# Patient Record
Sex: Female | Born: 1970 | Race: White | Hispanic: No | Smoking: Current every day smoker
Health system: Southern US, Community
[De-identification: ages and names within clinical notes are randomized; demographics above are authoritative.]

## PROBLEM LIST (undated history)

## (undated) DIAGNOSIS — I10 Essential (primary) hypertension: Secondary | ICD-10-CM

## (undated) DIAGNOSIS — T8859XA Other complications of anesthesia, initial encounter: Secondary | ICD-10-CM

## (undated) DIAGNOSIS — F99 Mental disorder, not otherwise specified: Secondary | ICD-10-CM

## (undated) DIAGNOSIS — J45909 Unspecified asthma, uncomplicated: Secondary | ICD-10-CM

## (undated) DIAGNOSIS — J449 Chronic obstructive pulmonary disease, unspecified: Secondary | ICD-10-CM

## (undated) HISTORY — DX: Mental disorder, not otherwise specified: F99

## (undated) HISTORY — PX: TUBAL LIGATION: SHX77

## (undated) HISTORY — DX: Essential (primary) hypertension: I10

## (undated) HISTORY — PX: DILATION AND CURETTAGE OF UTERUS: SHX78

## (undated) HISTORY — PX: CHOLECYSTECTOMY: SHX55

---

## 2001-03-18 ENCOUNTER — Emergency Department (HOSPITAL_COMMUNITY): Admission: EM | Admit: 2001-03-18 | Discharge: 2001-03-18 | Payer: Self-pay | Admitting: *Deleted

## 2001-03-18 ENCOUNTER — Encounter: Payer: Self-pay | Admitting: *Deleted

## 2006-01-12 ENCOUNTER — Emergency Department (HOSPITAL_COMMUNITY): Admission: EM | Admit: 2006-01-12 | Discharge: 2006-01-12 | Payer: Self-pay | Admitting: Emergency Medicine

## 2007-04-14 ENCOUNTER — Ambulatory Visit: Payer: Self-pay | Admitting: Cardiology

## 2013-10-10 ENCOUNTER — Encounter (HOSPITAL_COMMUNITY): Payer: Self-pay | Admitting: Emergency Medicine

## 2013-10-10 ENCOUNTER — Emergency Department (HOSPITAL_COMMUNITY)
Admission: EM | Admit: 2013-10-10 | Discharge: 2013-10-11 | Disposition: A | Discharge status: Home / Self Care | Payer: BC Managed Care – PPO | Attending: Emergency Medicine | Admitting: Emergency Medicine

## 2013-10-10 DIAGNOSIS — Z3202 Encounter for pregnancy test, result negative: Secondary | ICD-10-CM | POA: Insufficient documentation

## 2013-10-10 DIAGNOSIS — F32A Depression, unspecified: Secondary | ICD-10-CM

## 2013-10-10 DIAGNOSIS — F3289 Other specified depressive episodes: Secondary | ICD-10-CM | POA: Insufficient documentation

## 2013-10-10 DIAGNOSIS — F17200 Nicotine dependence, unspecified, uncomplicated: Secondary | ICD-10-CM | POA: Insufficient documentation

## 2013-10-10 DIAGNOSIS — Z046 Encounter for general psychiatric examination, requested by authority: Secondary | ICD-10-CM | POA: Insufficient documentation

## 2013-10-10 DIAGNOSIS — F329 Major depressive disorder, single episode, unspecified: Secondary | ICD-10-CM

## 2013-10-10 LAB — COMPREHENSIVE METABOLIC PANEL
ALT: 10 U/L (ref 0–35)
AST: 17 U/L (ref 0–37)
Albumin: 3.7 g/dL (ref 3.5–5.2)
Alkaline Phosphatase: 66 U/L (ref 39–117)
Anion gap: 11 (ref 5–15)
BUN: 8 mg/dL (ref 6–23)
CO2: 25 mEq/L (ref 19–32)
Calcium: 9 mg/dL (ref 8.4–10.5)
Chloride: 102 mEq/L (ref 96–112)
Creatinine, Ser: 0.72 mg/dL (ref 0.50–1.10)
GFR calc Af Amer: >90 mL/min (ref >90)
GFR calc non Af Amer: >90 mL/min (ref >90)
Glucose, Bld: 110 mg/dL — ABNORMAL HIGH (ref 70–99)
Potassium: 4 mEq/L (ref 3.7–5.3)
Sodium: 138 mEq/L (ref 137–147)
Total Bilirubin: 0.2 mg/dL — ABNORMAL LOW (ref 0.3–1.2)
Total Protein: 7.1 g/dL (ref 6.0–8.3)

## 2013-10-10 LAB — CBC WITH DIFFERENTIAL/PLATELET
Basophils Absolute: 0 10*3/uL (ref 0.0–0.1)
Basophils Relative: 0 % (ref 0–1)
Eosinophils Absolute: 0.1 10*3/uL (ref 0.0–0.7)
Eosinophils Relative: 1 % (ref 0–5)
HCT: 39.7 % (ref 36.0–46.0)
Hemoglobin: 13.9 g/dL (ref 12.0–15.0)
Lymphocytes Relative: 30 % (ref 12–46)
Lymphs Abs: 2.6 10*3/uL (ref 0.7–4.0)
MCH: 32.2 pg (ref 26.0–34.0)
MCHC: 35 g/dL (ref 30.0–36.0)
MCV: 91.9 fL (ref 78.0–100.0)
Monocytes Absolute: 0.6 10*3/uL (ref 0.1–1.0)
Monocytes Relative: 7 % (ref 3–12)
Neutro Abs: 5.3 10*3/uL (ref 1.7–7.7)
Neutrophils Relative %: 62 % (ref 43–77)
Platelets: 243 10*3/uL (ref 150–400)
RBC: 4.32 MIL/uL (ref 3.87–5.11)
RDW: 11.5 % (ref 11.5–15.5)
WBC: 8.5 10*3/uL (ref 4.0–10.5)

## 2013-10-10 NOTE — ED Notes (Addendum)
Pt states she is depressed and needs to be voluntary committed. Pt states her thoughts are racing and she is stressed out.

## 2013-10-11 LAB — RAPID URINE DRUG SCREEN, HOSP PERFORMED
Amphetamines: NOT DETECTED
Barbiturates: NOT DETECTED
Benzodiazepines: NOT DETECTED
Cocaine: NOT DETECTED
Opiates: NOT DETECTED
Tetrahydrocannabinol: NOT DETECTED

## 2013-10-11 LAB — URINALYSIS, ROUTINE W REFLEX MICROSCOPIC
Bilirubin Urine: NEGATIVE
Glucose, UA: NEGATIVE mg/dL
Ketones, ur: NEGATIVE mg/dL
Leukocytes, UA: NEGATIVE
Nitrite: NEGATIVE
Protein, ur: NEGATIVE mg/dL
Specific Gravity, Urine: <1.005 — ABNORMAL LOW (ref 1.005–1.030)
Urobilinogen, UA: 0.2 mg/dL (ref 0.0–1.0)
pH: 6 (ref 5.0–8.0)

## 2013-10-11 LAB — URINE MICROSCOPIC-ADD ON

## 2013-10-11 LAB — PREGNANCY, URINE: Preg Test, Ur: NEGATIVE

## 2013-10-11 MED ORDER — ACETAMINOPHEN 325 MG PO TABS
650.0000 mg | ORAL_TABLET | ORAL | Status: DC | PRN
  Administered 2013-10-11: 650 mg via ORAL
  Filled 2013-10-11: qty 2

## 2013-10-11 MED ORDER — ONDANSETRON HCL 4 MG PO TABS: 4.0000 mg | ORAL_TABLET | Freq: Three times a day (TID) | ORAL | Status: DC | PRN

## 2013-10-11 MED ORDER — LORAZEPAM 1 MG PO TABS
1.0000 mg | ORAL_TABLET | Freq: Three times a day (TID) | ORAL | Status: DC | PRN
  Administered 2013-10-11: 1 mg via ORAL
  Filled 2013-10-11: qty 1

## 2013-10-11 MED ORDER — ZOLPIDEM TARTRATE 5 MG PO TABS: 5.0000 mg | ORAL_TABLET | Freq: Every evening | ORAL | Status: DC | PRN

## 2013-10-11 MED ORDER — IBUPROFEN 400 MG PO TABS: 600.0000 mg | ORAL_TABLET | Freq: Three times a day (TID) | ORAL | Status: DC | PRN

## 2013-10-11 NOTE — ED Notes (Signed)
Discharge instructions reviewed with pt, questions answered. Pt verbalized understanding.  

## 2013-10-11 NOTE — ED Provider Notes (Signed)
Discussed with the [facility] counselor - pt is not endorsing any SI - just wants to be back on an antidepressant - she is amenable to f/u in the outpt setting.  I have confirmed this with the pt - I agree with the assessment - her mood is slightly depressed but she is hopeful for a good future with outpt counseling.    Johnna Acosta, MD 10/11/13 [phone]

## 2013-10-11 NOTE — ED Notes (Signed)
Patient complaining of headache. Rates pain at a 5.

## 2013-10-11 NOTE — ED Provider Notes (Signed)
CSN: [id number]     Arrival date & time 10/10/13  2050 History   First MD Initiated Contact with Patient 10/10/13 2056     Chief Complaint  Patient presents with  . V70.1     (Consider location/radiation/quality/duration/timing/severity/associated sxs/prior Treatment) HPI  15yF with depression. Family stress. Feels unappreciated by family. Uncontrollable crying at times. Apathy. Wondering if it would be better if she were dead. No plan to hurt herself. No SI. Denies drug or other substance abuse. Feels like she has no one to talk to. No previous psych care.   History reviewed. No pertinent past medical history. History reviewed. No pertinent past surgical history. History reviewed. No pertinent family history. History  Substance Use Topics  . Smoking status: Current Every Day Smoker    Types: Cigarettes  . Smokeless tobacco: Not on file  . Alcohol Use: No   OB History   Grav Para Term Preterm Abortions TAB SAB Ect Mult Living                 Review of Systems  All systems reviewed and negative, other than as noted in HPI.   Allergies  Sulfur; Clindamycin/lincomycin; and Codeine  Home Medications   Prior to Admission medications   Medication Sig Start Date End Date Taking? Authorizing Provider  acetaminophen (TYLENOL) 500 MG tablet Take 500 mg by mouth every 6 (six) hours as needed for mild pain or moderate pain.   Yes Historical Provider, MD  ALPRAZolam Duanne Moron) 0.5 MG tablet Take 1 tablet by mouth 2 (two) times daily as needed. For anxiety 09/21/13  Yes Historical Provider, MD  ENDOCET 10-325 MG per tablet Take 0.5-1 tablets by mouth every 6 (six) hours as needed. For pain 09/18/13  Yes Historical Provider, MD   BP 127/78  Pulse 67  Temp(Src) 98.2 F (36.8 C) (Oral)  Resp 20  Ht 5\' 6"  (1.676 m)  Wt 164 lb (74.39 kg)  BMI 26.48 kg/m2  SpO2 100% Physical Exam  Nursing note and vitals reviewed. Constitutional: She is oriented to person, place, and time. She appears  well-developed and well-nourished. No distress.  HENT:  Head: Normocephalic and atraumatic.  Eyes: Conjunctivae are normal. Pupils are equal, round, and reactive to light. Right eye exhibits no discharge. Left eye exhibits no discharge.  Neck: Neck supple.  Cardiovascular: Normal rate, regular rhythm and normal heart sounds.  Exam reveals no gallop and no friction rub.   No murmur heard. Pulmonary/Chest: Effort normal and breath sounds normal. No respiratory distress.  Abdominal: Soft. She exhibits no distension. There is no tenderness.  Musculoskeletal: She exhibits no edema and no tenderness.  Neurological: She is alert and oriented to person, place, and time. She exhibits normal muscle tone.  Skin: Skin is warm and dry.  Psychiatric:  Speech clear. content appropriate. Crying at times. Endorses feelings of hopelessness. Doesn't appear to be responding to internal stimuli.     ED Course  Procedures (including critical care time) Labs Review Labs Reviewed  COMPREHENSIVE METABOLIC PANEL - Abnormal; Notable for the following:    Glucose, Bld 110 (*)    Total Bilirubin 0.2 (*)    All other components within normal limits  CBC WITH DIFFERENTIAL  URINALYSIS, ROUTINE W REFLEX MICROSCOPIC  URINE RAPID DRUG SCREEN (HOSP PERFORMED)  PREGNANCY, URINE    Imaging Review No results found.   EKG Interpretation None      MDM   Final diagnoses:  Depression    57yF with depression. Little support.  No previous psych care. No SI or HI. I don't feel she needs to necessarily be committed, but may possible benefit from inpt. Will obtain psych eval for possible psych meds and dispo recommendations.    Virgel Manifold, MD 10/11/13 [phone]

## 2013-10-11 NOTE — BH Assessment (Signed)
Tele Assessment Note   Robin Baldwin is an 43 y.o. female presenting to [facility] for an evaluation. Patient presented to [facility] via EMS. Sts that her coworkers called b/c patient was tearful, anxious, and so depressed that she could not do her job. Patient sts that she has a lot of stressors in her life at this time. Her stress is related mainly to her relationship with her mother. Sts that she has lived with her mother x5 yrs. Since living with her mother they have not gotten along. Patient's mother will not allow her to date and has "ran off" any boyfriends she has had in the past. Her mother questions her about where she is going and when she is returning home. Her mother also Geologist, engineering with patient and her brothers. Patient feels that her mother doesn't like her and wants her dead. Sts that she can't move out b/c her mother is unable to financially make it on her own. Patient helps her mother with household finances by living in the same home.   Patient denies SI and HI. No AVH's. No alcohol of drug use. She does not have a psychiatrist/therapist.    Axis I: Depressive Disorder NOS Axis II: Deferred Axis III: History reviewed. No pertinent past medical history. Axis IV: other psychosocial or environmental problems, problems related to social environment, problems with access to health care services and problems with primary support group Axis V: 31-40 impairment in reality testing  Past Medical History: History reviewed. No pertinent past medical history.  History reviewed. No pertinent past surgical history.  Family History: History reviewed. No pertinent family history.  Social History:  reports that she has been smoking Cigarettes.  She has been smoking about 0.00 packs per day. She does not have any smokeless tobacco history on file. She reports that she does not drink alcohol or use illicit drugs.  Additional Social History:  Alcohol / Drug Use Pain Medications: SEE  MAR Prescriptions: SEE MAR Over the Counter: SEE MAR History of alcohol / drug use?: No history of alcohol / drug abuse  CIWA: CIWA-Ar BP: 127/78 mmHg Pulse Rate: 67 COWS:    Allergies:  Allergies  Allergen Reactions  . Sulfur Hives and Other (See Comments)    Flushing of skin, burning sensation, blisters  . Clindamycin/Lincomycin Hives  . Codeine Itching    Home Medications:  (Not in a hospital admission)  OB/GYN Status:  No LMP recorded.  General Assessment Data Location of Assessment: [facility] Is this a Tele or Face-to-Face Assessment?: Tele Assessment Living Arrangements: Other (Comment) (pt lives with mother ) Can pt return to current living arrangement?: Yes Admission Status: Voluntary Is patient capable of signing voluntary admission?: Yes Transfer from: [facility] Referral Source: Self/Family/Friend     [place] Living Arrangements: Other (Comment) (pt lives with mother ) Name of Psychiatrist:  (No psychiatrist ) Name of Therapist:  (No therapist )     Risk to self Suicidal Ideation: No Suicidal Intent: No Is patient at risk for suicide?: No Suicidal Plan?: No Access to Means: No What has been your use of drugs/alcohol within the last 12 months?:  (patient denies ) Previous Attempts/Gestures: No How many times?:  (0) Other Self Harm Risks:  (n/a) Triggers for Past Attempts: Other (Comment) (no previous attempts or gestures ) Intentional Self Injurious Behavior: None Family Suicide History: No Recent stressful life event(s): Other (Comment) (conflict with mother ) Persecutory voices/beliefs?: No Depression: Yes Depression Symptoms: Feeling angry/irritable;Feeling worthless/self  pity;Loss of interest in usual pleasures;Guilt;Fatigue;Isolating;Tearfulness;Insomnia;Despondent Substance abuse history and/or treatment for substance abuse?: No Suicide prevention information given to non-admitted patients: Not applicable  Risk to  Others Homicidal Ideation: No Thoughts of Harm to Others: No Current Homicidal Intent: No Current Homicidal Plan: No Access to Homicidal Means: No Identified Victim:  (n/a) History of harm to others?: No Assessment of Violence: None Noted Violent Behavior Description:  (patient is calm and cooperative ) Does patient have access to weapons?: No Criminal Charges Pending?: No Does patient have a court date: Yes  Psychosis Hallucinations: None noted Delusions: None noted  Mental Status Report Appear/Hygiene: Disheveled Eye Contact: Good Motor Activity: Freedom of movement Speech: Logical/coherent Level of Consciousness: Alert Mood: Depressed Affect: Appropriate to circumstance Anxiety Level: Minimal Thought Processes: Coherent;Relevant Judgement: Unimpaired Orientation: Person;Place;Time;Situation Obsessive Compulsive Thoughts/Behaviors: None  Cognitive Functioning Concentration: Decreased Memory: Recent Intact;Remote Intact IQ: Average Insight: Good Impulse Control: Fair Appetite: Poor Weight Loss:  (none reported ) Weight Gain:  (none reported) Sleep: No Change Vegetative Symptoms: None  ADLScreening [facility] Assessment Services) Patient's cognitive ability adequate to safely complete daily activities?: Yes Patient able to express need for assistance with ADLs?: Yes Independently performs ADLs?: Yes (appropriate for developmental age)  Prior Inpatient Therapy Prior Inpatient Therapy: No Prior Therapy Dates:  (n/a) Prior Therapy Facilty/Provider(s):  (n/a) Reason for Treatment:  (n/a)  Prior Outpatient Therapy Prior Outpatient Therapy: No Prior Therapy Dates: n/a Prior Therapy Facilty/Provider(s):  (n/a) Reason for Treatment:  (n/a)  ADL Screening (condition at time of admission) Patient's cognitive ability adequate to safely complete daily activities?: Yes Is the patient deaf or have difficulty hearing?: No Does the patient have difficulty seeing, even when  wearing glasses/contacts?: No Does the patient have difficulty concentrating, remembering, or making decisions?: No Patient able to express need for assistance with ADLs?: Yes Does the patient have difficulty dressing or bathing?: No Independently performs ADLs?: Yes (appropriate for developmental age) Does the patient have difficulty walking or climbing stairs?: No Weakness of Legs: None Weakness of Arms/Hands: None  Home Assistive Devices/Equipment Home Assistive Devices/Equipment: None    Abuse/Neglect Assessment (Assessment to be complete while patient is alone) Physical Abuse: Denies Verbal Abuse: Denies Sexual Abuse: Denies Exploitation of patient/patient's resources: Denies Self-Neglect: Denies Values / Beliefs Cultural Requests During Hospitalization: None Spiritual Requests During Hospitalization: None   Advance Directives (For Healthcare) Advance Directive: Patient does not have advance directive Nutrition Screen- [place] Adult/[facility] Patient's home diet: Regular  Additional Information 1:1 In Past 12 Months?: No CIRT Risk: No Elopement Risk: No Does patient have medical clearance?: Yes     Disposition:  Disposition Initial Assessment Completed for this Encounter: Yes Disposition of Patient: Other dispositions;Outpatient treatment (Discharge home per [facility] with outpatient referrals ) Type of outpatient treatment: Adult ([facility] outpatient ) Other disposition(s): Other (Comment)  Writer informed EDP and patient's nurse of discharge instructions. EDP agreed to the discharge plan with the appropriate follow up plan. Patient to follow up with Dr. Harrington Challenger at the [facility] office for outpatient services. Writer faxed the office's contact information to patient's nurse at [place].   Waldon Merl [facility] 10/11/2013 7:59 AM

## 2013-10-11 NOTE — Discharge Instructions (Signed)

## 2014-10-14 ENCOUNTER — Encounter (HOSPITAL_COMMUNITY): Payer: Self-pay | Admitting: *Deleted

## 2014-10-14 ENCOUNTER — Emergency Department (HOSPITAL_COMMUNITY): Payer: Self-pay

## 2014-10-14 ENCOUNTER — Emergency Department (HOSPITAL_COMMUNITY)
Admission: EM | Admit: 2014-10-14 | Discharge: 2014-10-14 | Discharge status: Against Medical Advice | Payer: Self-pay | Attending: Emergency Medicine | Admitting: Emergency Medicine

## 2014-10-14 DIAGNOSIS — J449 Chronic obstructive pulmonary disease, unspecified: Secondary | ICD-10-CM | POA: Insufficient documentation

## 2014-10-14 DIAGNOSIS — R079 Chest pain, unspecified: Secondary | ICD-10-CM | POA: Insufficient documentation

## 2014-10-14 DIAGNOSIS — Z720 Tobacco use: Secondary | ICD-10-CM | POA: Insufficient documentation

## 2014-10-14 HISTORY — DX: Chronic obstructive pulmonary disease, unspecified: J44.9

## 2014-10-14 LAB — CBC WITH DIFFERENTIAL/PLATELET
BASOS ABS: 0 10*3/uL (ref 0.0–0.1)
Basophils Relative: 1 % (ref 0–1)
Eosinophils Absolute: 0.1 10*3/uL (ref 0.0–0.7)
Eosinophils Relative: 1 % (ref 0–5)
HEMATOCRIT: 39.5 % (ref 36.0–46.0)
HEMOGLOBIN: 13.1 g/dL (ref 12.0–15.0)
LYMPHS PCT: 29 % (ref 12–46)
Lymphs Abs: 2.4 10*3/uL (ref 0.7–4.0)
MCH: 31.2 pg (ref 26.0–34.0)
MCHC: 33.2 g/dL (ref 30.0–36.0)
MCV: 94 fL (ref 78.0–100.0)
MONOS PCT: 8 % (ref 3–12)
Monocytes Absolute: 0.7 10*3/uL (ref 0.1–1.0)
NEUTROS ABS: 5.1 10*3/uL (ref 1.7–7.7)
Neutrophils Relative %: 61 % (ref 43–77)
Platelets: 230 10*3/uL (ref 150–400)
RBC: 4.2 MIL/uL (ref 3.87–5.11)
RDW: 12.4 % (ref 11.5–15.5)
WBC: 8.2 10*3/uL (ref 4.0–10.5)

## 2014-10-14 LAB — BASIC METABOLIC PANEL
ANION GAP: 4 — AB (ref 5–15)
BUN: 9 mg/dL (ref 6–20)
CO2: 26 mmol/L (ref 22–32)
Calcium: 8.4 mg/dL — ABNORMAL LOW (ref 8.9–10.3)
Chloride: 109 mmol/L (ref 101–111)
Creatinine, Ser: 0.68 mg/dL (ref 0.44–1.00)
GFR calc Af Amer: >60 mL/min (ref >60)
GFR calc non Af Amer: >60 mL/min (ref >60)
Glucose, Bld: 89 mg/dL (ref 65–99)
POTASSIUM: 3.8 mmol/L (ref 3.5–5.1)
Sodium: 139 mmol/L (ref 135–145)

## 2014-10-14 LAB — TROPONIN I: Troponin I: <0.03 ng/mL (ref <0.031)

## 2014-10-14 NOTE — ED Notes (Signed)
Pt with hx of COPD, working outside this morning and began to have mid CP, pt states significant family hx of heart disease/MI

## 2015-12-29 ENCOUNTER — Ambulatory Visit (INDEPENDENT_AMBULATORY_CARE_PROVIDER_SITE_OTHER): Payer: BLUE CROSS/BLUE SHIELD | Admitting: Women's Health

## 2015-12-29 ENCOUNTER — Encounter: Payer: Self-pay | Admitting: Women's Health

## 2015-12-29 VITALS — BP 120/80 | HR 88 | Ht 66.0 in | Wt 187.0 lb

## 2015-12-29 DIAGNOSIS — N921 Excessive and frequent menstruation with irregular cycle: Secondary | ICD-10-CM | POA: Diagnosis not present

## 2015-12-29 DIAGNOSIS — R42 Dizziness and giddiness: Secondary | ICD-10-CM | POA: Diagnosis not present

## 2015-12-29 DIAGNOSIS — Z86018 Personal history of other benign neoplasm: Secondary | ICD-10-CM | POA: Diagnosis not present

## 2015-12-29 DIAGNOSIS — R252 Cramp and spasm: Secondary | ICD-10-CM

## 2015-12-29 LAB — POCT WET PREP (WET MOUNT)
CLUE CELLS WET PREP WHIFF POC: NEGATIVE
Trichomonas Wet Prep HPF POC: ABSENT

## 2015-12-29 LAB — POCT HEMOGLOBIN: HEMOGLOBIN: 12.9 g/dL (ref 12.2–16.2)

## 2015-12-29 NOTE — Addendum Note (Signed)
Addended by: Traci Sermon A on: 12/29/2015 03:32 PM   Modules accepted: Orders

## 2015-12-29 NOTE — Patient Instructions (Signed)
Call us when your period starts to get a pelvic ultrasound scheduled for when your period goes off

## 2015-12-29 NOTE — Progress Notes (Signed)
   [facility] Visit  Patient name: Robin Baldwin MRN [id number]  Date of birth: 1970-10-27  CC & HPI:  Robin Baldwin is a 45 y.o. G66P0020 Caucasian female presenting today for report of heavy irregular periods lasting 5-6d since BTL 2010, but has been worse for last 50mths. Changing saturated super tampon q 2-3hrs w/ some overflow onto pad. Quarter-sized clots. +cramping. Has intermenstrual bleeding that is lighter. Weak/dizzy during periods. Had labs w/ PCP ~1-9mths ago, unaware of any abnormal labs.  Was told at time of BTL she has uterine fibroids. Hasn't had pap since 2010- was normal then. H/O urinary retention x 2 w/ anesthesia so is concerned about having surgery.   Patient's last menstrual period was 12/18/2015.   Pertinent History Reviewed:  Medical & Surgical Hx:   Past medical, surgical, family, and social history reviewed in electronic medical record Medications: Reviewed & Updated - see associated section Allergies: Reviewed in electronic medical record  Objective Findings:  Vitals: BP 120/80   Pulse 88   Ht 5\' 6"  (1.676 m)   Wt 187 lb (84.8 kg)   LMP 12/18/2015   BMI 30.18 kg/m  Body mass index is 30.18 kg/m.  Physical Examination: General appearance - alert, well appearing, and in no distress Pelvic - cx nulliparous w/o lesions/polyps, normal nonodorous d/c Bimanual: uterus feels normal size w/o tenderness, no adnexal masses/tenderness  Fingerstick Hgb today: 12.9  Assessment & Plan:  A:   Menometrorrhagia  H/O fibroids  S/P BTL  P:  Needs pap  Return for will call when period starts for pelvic u/s when period is off. w/ f/u w/ me after to discuss results  GC/CT from urine today  Tawnya Crook CNM, [facility] 12/29/2015 3:22 PM

## 2015-12-31 LAB — GC/CHLAMYDIA PROBE AMP
Chlamydia trachomatis, NAA: NEGATIVE
Neisseria gonorrhoeae by PCR: NEGATIVE

## 2016-01-09 ENCOUNTER — Emergency Department (HOSPITAL_COMMUNITY)
Admission: EM | Admit: 2016-01-09 | Discharge: 2016-01-09 | Disposition: A | Discharge status: Home / Self Care | Payer: BLUE CROSS/BLUE SHIELD | Attending: Emergency Medicine | Admitting: Emergency Medicine

## 2016-01-09 ENCOUNTER — Emergency Department (HOSPITAL_COMMUNITY): Payer: BLUE CROSS/BLUE SHIELD

## 2016-01-09 ENCOUNTER — Encounter (HOSPITAL_COMMUNITY): Payer: Self-pay | Admitting: Emergency Medicine

## 2016-01-09 DIAGNOSIS — Z79899 Other long term (current) drug therapy: Secondary | ICD-10-CM | POA: Insufficient documentation

## 2016-01-09 DIAGNOSIS — J40 Bronchitis, not specified as acute or chronic: Secondary | ICD-10-CM | POA: Insufficient documentation

## 2016-01-09 DIAGNOSIS — J449 Chronic obstructive pulmonary disease, unspecified: Secondary | ICD-10-CM | POA: Diagnosis not present

## 2016-01-09 DIAGNOSIS — F17210 Nicotine dependence, cigarettes, uncomplicated: Secondary | ICD-10-CM | POA: Insufficient documentation

## 2016-01-09 DIAGNOSIS — R42 Dizziness and giddiness: Secondary | ICD-10-CM | POA: Diagnosis present

## 2016-01-09 LAB — CBG MONITORING, ED: GLUCOSE-CAPILLARY: 91 mg/dL (ref 65–99)

## 2016-01-09 LAB — CBC
HEMATOCRIT: 39.6 % (ref 36.0–46.0)
HEMOGLOBIN: 13.8 g/dL (ref 12.0–15.0)
MCH: 32.1 pg (ref 26.0–34.0)
MCHC: 34.8 g/dL (ref 30.0–36.0)
MCV: 92.1 fL (ref 78.0–100.0)
Platelets: 244 10*3/uL (ref 150–400)
RBC: 4.3 MIL/uL (ref 3.87–5.11)
RDW: 12 % (ref 11.5–15.5)
WBC: 8.9 10*3/uL (ref 4.0–10.5)

## 2016-01-09 LAB — URINALYSIS, ROUTINE W REFLEX MICROSCOPIC
Bilirubin Urine: NEGATIVE
Glucose, UA: NEGATIVE mg/dL
Hgb urine dipstick: NEGATIVE
Ketones, ur: NEGATIVE mg/dL
Leukocytes, UA: NEGATIVE
NITRITE: NEGATIVE
PH: 6.5 (ref 5.0–8.0)
Protein, ur: NEGATIVE mg/dL
SPECIFIC GRAVITY, URINE: 1.01 (ref 1.005–1.030)

## 2016-01-09 LAB — BASIC METABOLIC PANEL
ANION GAP: 8 (ref 5–15)
BUN: 9 mg/dL (ref 6–20)
CHLORIDE: 105 mmol/L (ref 101–111)
CO2: 24 mmol/L (ref 22–32)
Calcium: 8.9 mg/dL (ref 8.9–10.3)
Creatinine, Ser: 0.58 mg/dL (ref 0.44–1.00)
Glucose, Bld: 91 mg/dL (ref 65–99)
POTASSIUM: 3.8 mmol/L (ref 3.5–5.1)
SODIUM: 137 mmol/L (ref 135–145)

## 2016-01-09 MED ORDER — SODIUM CHLORIDE 0.9 % IV SOLN: INTRAVENOUS | Status: DC

## 2016-01-09 MED ORDER — ONDANSETRON HCL 4 MG/2ML IJ SOLN
4.0000 mg | Freq: Once | INTRAMUSCULAR | Status: AC
  Administered 2016-01-09: 4 mg via INTRAVENOUS
  Filled 2016-01-09: qty 2

## 2016-01-09 MED ORDER — SODIUM CHLORIDE 0.9 % IV BOLUS (SEPSIS)
500.0000 mL | Freq: Once | INTRAVENOUS | Status: AC
  Administered 2016-01-09: 19:00:00 via INTRAVENOUS

## 2016-01-09 NOTE — ED Triage Notes (Addendum)
PT c/o generalized weakness, dizziness at times and states was seen at urgent care on 01/05/16 and was started on prednisone, cough medication and doxycyline. PT states she drove herself to ED today. PT states she has had HTN today as well.

## 2016-01-09 NOTE — Discharge Instructions (Signed)
Continue usual inhalers. Recommend continuing to use the prednisone. Would also recommend finishing out the course of doxycycline. Today's workup without any evidence of pneumonia. Blood pressure is doing fine here. They can plummet to follow-up with your doctor. Work note provided.

## 2016-01-09 NOTE — ED Provider Notes (Signed)
[facility] [place] DEPT Provider Note   CSN: [id number] Arrival date & time: 01/09/16  1621     History   Chief Complaint Chief Complaint  Patient presents with  . Weakness    HPI Robin Baldwin is a 45 y.o. female.  Patient with complaint of generalized weakness dizziness. Seen in urgent care on October 16 started on prednisone cough medication and doxycycline. Patient still not feeling better. Blood pressures at home and been elevated. Patient with complaint of headache and nausea occasional productive cough. Blood pressures at home are 168/100. Some blurred vision left eye subjective fevers. Patient did not have a chest x-ray. Patient denies any focal weakness or numbness. No speech problems. No stiff neck.      Past Medical History:  Diagnosis Date  . COPD (chronic obstructive pulmonary disease) [facility])     Patient Active Problem List   Diagnosis Date Noted  . Menometrorrhagia 12/29/2015    Past Surgical History:  Procedure Laterality Date  . CHOLECYSTECTOMY    . DILATION AND CURETTAGE OF UTERUS    . TUBAL LIGATION      OB History    Gravida Para Term Preterm AB Living   2       2 0   SAB TAB Ectopic Multiple Live Births   2               Home Medications    Prior to Admission medications   Medication Sig Start Date End Date Taking? Authorizing Provider  ALPRAZolam Duanne Moron) 0.5 MG tablet Take 1 tablet by mouth 2 (two) times daily as needed. For anxiety 09/21/13  Yes Historical Provider, MD  brompheniramine-pseudoephedrine-DM 30-2-10 MG/5ML syrup Take 5 mLs by mouth 4 (four) times daily as needed (for cough).  01/05/16  Yes Historical Provider, MD  cyclobenzaprine (FLEXERIL) 10 MG tablet Take 10 mg by mouth 3 (three) times daily as needed for muscle spasms.  10/23/15  Yes Historical Provider, MD  doxycycline (VIBRAMYCIN) 100 MG capsule Take 100 mg by mouth 2 (two) times daily. 10 day course starting on 01/05/2016   Yes Historical Provider, MD  ENDOCET 10-325 MG per  tablet Take 0.5-1 tablets by mouth every 6 (six) hours as needed. For pain 09/18/13  Yes Historical Provider, MD  predniSONE (DELTASONE) 20 MG tablet Take 20 mg by mouth 2 (two) times daily. 5 day course starting on 01/05/2016 01/05/16  Yes Historical Provider, MD  Vitamin D, Ergocalciferol, (DRISDOL) 50000 units CAPS capsule Take 1 capsule by mouth 2 (two) times a week. 01/02/16  Yes Historical Provider, MD    Family History Family History  Problem Relation Age of Onset  . Heart attack Father   . Heart failure Mother   . Drug abuse Brother     drug over dose  . Heart attack Brother     Social History Social History  Substance Use Topics  . Smoking status: Current Every Day Smoker    Packs/day: 1.50    Types: Cigarettes  . Smokeless tobacco: Never Used  . Alcohol use No     Allergies   Sulfur; Clindamycin/lincomycin; and Codeine   Review of Systems Review of Systems  Constitutional: Positive for fatigue. Negative for fever.  HENT: Positive for congestion.   Eyes: Positive for visual disturbance. Negative for photophobia and redness.  Respiratory: Positive for cough and shortness of breath.   Gastrointestinal: Positive for nausea. Negative for abdominal pain.  Genitourinary: Negative for dysuria.  Musculoskeletal: Negative for back pain and neck stiffness.  Skin: Negative for rash.  Neurological: Positive for dizziness, weakness and headaches.  Hematological: Does not bruise/bleed easily.  Psychiatric/Behavioral: Negative for confusion.     Physical Exam Updated Vital Signs BP 135/73 (BP Location: Left Arm)   Pulse 65   Temp 98.3 F (36.8 C) (Oral)   Resp 16   Ht 5\' 6"  (1.676 m)   Wt 83.9 kg   LMP 12/18/2015   SpO2 100%   BMI 29.86 kg/m   Physical Exam  Constitutional: She is oriented to person, place, and time. She appears well-developed and well-nourished. No distress.  HENT:  Head: Normocephalic and atraumatic.  Mouth/Throat: Oropharynx is clear and  moist.  Eyes: Conjunctivae and EOM are normal. Pupils are equal, round, and reactive to light.  Neck: Normal range of motion. Neck supple.  Cardiovascular: Normal rate, regular rhythm and normal heart sounds.   Pulmonary/Chest: Effort normal and breath sounds normal. No respiratory distress. She has no wheezes.  Abdominal: Soft. Bowel sounds are normal. There is no tenderness.  Musculoskeletal: Normal range of motion.  Neurological: She is alert and oriented to person, place, and time. No cranial nerve deficit. She exhibits normal muscle tone. Coordination normal.  Skin: Skin is warm. Capillary refill takes less than 2 seconds.  Nursing note and vitals reviewed.    ED Treatments / Results  Labs (all labs ordered are listed, but only abnormal results are displayed) Labs Reviewed  CBC  URINALYSIS, ROUTINE W REFLEX MICROSCOPIC (NOT AT [facility])  BASIC METABOLIC PANEL  CBG MONITORING, ED    EKG  EKG Interpretation  Date/Time:  Friday January 09 2016 17:36:20 EDT Ventricular Rate:  57 PR Interval:    QRS Duration: 92 QT Interval:  419 QTC Calculation: 408 R Axis:   72 Text Interpretation:  Sinus rhythm ST elev, probable normal early repol pattern No significant change since last tracing Confirmed by Joshuah Minella  MD, [place] [phone]) on 01/09/2016 6:17:51 PM       Radiology Dg Chest 2 View  Result Date: 01/09/2016 CLINICAL DATA:  Cough, dizziness and hypertension EXAM: CHEST  2 VIEW COMPARISON:  02/08/2015 FINDINGS: The lungs are slightly hyperinflated. No pneumonic consolidation, CHF, effusion or pneumothorax. The cardiac mediastinal contours are within normal limits and stable. No acute osseous abnormality. IMPRESSION: Hyperinflated appearance of the lungs. Electronically Signed   By: Ashley Royalty M.D.   On: 01/09/2016 19:08   Ct Head Wo Contrast  Result Date: 01/09/2016 CLINICAL DATA:  46 year old female with generalize weakness and dizziness. Hypertension. Blurred vision in the  left side. Headache. EXAM: CT HEAD WITHOUT CONTRAST TECHNIQUE: Contiguous axial images were obtained from the base of the skull through the vertex without intravenous contrast. COMPARISON:  No priors. FINDINGS: Brain: No evidence of acute infarction, hemorrhage, hydrocephalus, extra-axial collection or mass lesion/mass effect. Vascular: No hyperdense vessel or unexpected calcification. Skull: Normal. Negative for fracture or focal lesion. Sinuses/Orbits: No acute finding. Other: None. IMPRESSION: 1. No acute intracranial abnormalities. 2. The appearance of the brain is normal. Electronically Signed   By: Vinnie Langton M.D.   On: 01/09/2016 19:22    Procedures Procedures (including critical care time)  Medications Ordered in ED Medications  0.9 %  sodium chloride infusion (not administered)  sodium chloride 0.9 % bolus 500 mL ( Intravenous New Bag/Given 01/09/16 1842)  ondansetron (ZOFRAN) injection 4 mg (4 mg Intravenous Given 01/09/16 1843)     Initial Impression / Assessment and Plan / ED Course  I have reviewed the triage vital signs  and the nursing notes.  Pertinent labs & imaging results that were available during my care of the patient were reviewed by me and considered in my medical decision making (see chart for details).  Clinical Course    Patient with extensive workup here feel the symptoms may be related to a viral illness bronchitis or an exacerbation of COPD. Patient recommended to continue her prednisone no wheezing here. Continue her inhalers. And continue the doxycycline. This seems to be overall helping her. Patient's blood pressures here fine. Patient's dizziness is head CT negative that continues she will need MRI of the brain.  Final Clinical Impressions(s) / ED Diagnoses   Final diagnoses:  Bronchitis    New Prescriptions New Prescriptions   No medications on file     Fredia Sorrow, MD 01/09/16 2035

## 2016-01-20 ENCOUNTER — Other Ambulatory Visit: Payer: Self-pay | Admitting: Women's Health

## 2016-01-20 DIAGNOSIS — N921 Excessive and frequent menstruation with irregular cycle: Secondary | ICD-10-CM

## 2016-01-20 DIAGNOSIS — Z86018 Personal history of other benign neoplasm: Secondary | ICD-10-CM

## 2016-01-21 ENCOUNTER — Ambulatory Visit (INDEPENDENT_AMBULATORY_CARE_PROVIDER_SITE_OTHER): Payer: BLUE CROSS/BLUE SHIELD

## 2016-01-21 DIAGNOSIS — N854 Malposition of uterus: Secondary | ICD-10-CM | POA: Diagnosis not present

## 2016-01-21 DIAGNOSIS — Z86018 Personal history of other benign neoplasm: Secondary | ICD-10-CM

## 2016-01-21 DIAGNOSIS — D252 Subserosal leiomyoma of uterus: Secondary | ICD-10-CM | POA: Diagnosis not present

## 2016-01-21 DIAGNOSIS — N921 Excessive and frequent menstruation with irregular cycle: Secondary | ICD-10-CM | POA: Diagnosis not present

## 2016-01-21 NOTE — Progress Notes (Signed)
PELVIC US TA/TV: anteverted uterus w/mult.fibroids (#1) subserosal fibroid fundal right 1.7 x 2.4 x 1.3 cm,(#2) pedunculated fibroid left  3.3 x 3.3 x 2.7 cm, EEC 7.9 mm,endometrium contains a 1.7 x .9 x 1.3 cm echogenic mass with a feeder vessel (? Polyp),normal ov's bilat,no free fluid seen,no pain,ov's appear mobile.

## 2016-01-30 ENCOUNTER — Other Ambulatory Visit (HOSPITAL_COMMUNITY)
Admission: RE | Admit: 2016-01-30 | Discharge: 2016-01-30 | Disposition: A | Discharge status: Home / Self Care | Payer: BLUE CROSS/BLUE SHIELD | Source: Ambulatory Visit | Attending: Obstetrics & Gynecology | Admitting: Obstetrics & Gynecology

## 2016-01-30 ENCOUNTER — Ambulatory Visit (INDEPENDENT_AMBULATORY_CARE_PROVIDER_SITE_OTHER): Payer: BLUE CROSS/BLUE SHIELD | Admitting: Women's Health

## 2016-01-30 ENCOUNTER — Encounter: Payer: Self-pay | Admitting: Women's Health

## 2016-01-30 VITALS — BP 120/68 | HR 72 | Ht 65.0 in | Wt 182.0 lb

## 2016-01-30 DIAGNOSIS — Z01411 Encounter for gynecological examination (general) (routine) with abnormal findings: Secondary | ICD-10-CM

## 2016-01-30 DIAGNOSIS — Z1151 Encounter for screening for human papillomavirus (HPV): Secondary | ICD-10-CM | POA: Insufficient documentation

## 2016-01-30 DIAGNOSIS — Z113 Encounter for screening for infections with a predominantly sexual mode of transmission: Secondary | ICD-10-CM | POA: Diagnosis present

## 2016-01-30 DIAGNOSIS — D259 Leiomyoma of uterus, unspecified: Secondary | ICD-10-CM

## 2016-01-30 DIAGNOSIS — F17200 Nicotine dependence, unspecified, uncomplicated: Secondary | ICD-10-CM | POA: Insufficient documentation

## 2016-01-30 DIAGNOSIS — F17210 Nicotine dependence, cigarettes, uncomplicated: Secondary | ICD-10-CM | POA: Insufficient documentation

## 2016-01-30 DIAGNOSIS — N840 Polyp of corpus uteri: Secondary | ICD-10-CM

## 2016-01-30 DIAGNOSIS — Z01419 Encounter for gynecological examination (general) (routine) without abnormal findings: Secondary | ICD-10-CM

## 2016-01-30 DIAGNOSIS — J449 Chronic obstructive pulmonary disease, unspecified: Secondary | ICD-10-CM | POA: Insufficient documentation

## 2016-01-30 NOTE — Progress Notes (Signed)
Subjective:   Robin Baldwin is a 45 y.o. G46P0020 Caucasian female here for a routine well-woman exam. Also for f/u of pelvic u/s done 11/1 for heavy/irregular periods since BTL 2010 but worse in last 10-94mths. Periods last x5-6d, changes saturated super tampon q 2-3hrs w/ some overflow onto pad. Quarter-sized clots. +cramping. Intermenstrual bleeding that is lighter. Had normal labs w/ PCP ~2-18mths ago.  Patient's last menstrual period was 01/16/2016.    Current complaints: none PCP: Dr. Melina Copa, [facility]       Does not desire labs, does w/ PCP. 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  Past Medical History Past Medical History:  Diagnosis Date  . COPD (chronic obstructive pulmonary disease) ([place])     Past Surgical History Past Surgical History:  Procedure Laterality Date  . CHOLECYSTECTOMY    . DILATION AND CURETTAGE OF UTERUS    . TUBAL LIGATION      Gynecologic History G2P0020  Patient's last menstrual period was 01/16/2016. Contraception: tubal ligation Last Pap: 2010. Results were: normal Last mammogram: ~51yrs ago. Results were: normal Last TCS: never  Obstetric History OB History  Gravida Para Term Preterm AB Living  2       2 0  SAB TAB Ectopic Multiple Live Births  2            # Outcome Date GA Lbr Len/2nd Weight Sex Delivery Anes PTL Lv  2 SAB           1 SAB               Current Medications Current Outpatient Prescriptions on File Prior to Visit  Medication Sig Dispense Refill  . ALPRAZolam (XANAX) 0.5 MG tablet Take 1  tablet by mouth 2 (two) times daily as needed. For anxiety    . brompheniramine-pseudoephedrine-DM 30-2-10 MG/5ML syrup Take 5 mLs by mouth 4 (four) times daily as needed (for cough).     . cyclobenzaprine (FLEXERIL) 10 MG tablet Take 10 mg by mouth 3 (three) times daily as needed for muscle spasms.     . ENDOCET 10-325 MG per tablet Take 0.5-1 tablets by mouth every 6 (six) hours as needed. For pain    . predniSONE (DELTASONE) 20 MG tablet Take 20 mg by mouth 2 (two) times daily. 5 day course starting on 01/05/2016    . Vitamin D, Ergocalciferol, (DRISDOL) 50000 units CAPS capsule Take 1 capsule by mouth 2 (two) times a week.    . doxycycline (VIBRAMYCIN) 100 MG capsule Take 100 mg by mouth 2 (two) times daily. 10 day course starting on 01/05/2016     No current facility-administered medications on file prior to visit.     Review of Systems Patient denies any headaches, blurred vision, shortness of breath, chest pain, abdominal pain, problems with bowel movements, urination, or intercourse.  Objective:  BP 120/68 (BP Location: Right Arm, Patient Position: Sitting, Cuff Size: Normal)   Pulse 72   Ht 5\' 5"  (1.651 m)   Wt 182 lb (82.6 kg)   LMP 01/16/2016   BMI 30.29 kg/m  Physical Exam  General:  Well developed, well nourished, no  acute distress. She is alert and oriented x3. Skin:  Warm and dry Neck:  Midline trachea, no thyromegaly or nodules Cardiovascular: Regular rate and rhythm, no murmur heard Lungs:  Effort normal, all lung fields clear to auscultation bilaterally Breasts:  No dominant palpable mass, retraction, or nipple discharge Abdomen:  Soft, non tender, no hepatosplenomegaly or masses Pelvic:  External genitalia is normal in appearance.  The vagina is normal in appearance. The cervix is bulbous, no CMT.  Thin prep pap is done w/ HR HPV cotesting. Uterus is felt to be normal size, shape, and contour.  No adnexal masses or tenderness noted. Extremities:  No swelling or  varicosities noted Psych:  She has a normal mood and affect   Pelvic u/s 01/21/16:  Robin Baldwin is a 45 y.o. G2P0020 LMP 01/16/2016 for a pelvic sonogram for menometrorrhagia,with a history of fibroids.  Uterus                      8.4 x 5.7 x 4 cm, anteverted uterus w/mult. fibroids Endometrium          7.9 mm, symmetrical, endometrium contains a 1.7 x .9 x 1.3 cm echogenic mass with a feeder vessel (? Polyp) Right ovary             2.7 x 2.9 x 1.5 cm, wnl Left ovary                2 x 1.6 x 3.7 cm, wnl Fibroids:  (#1) subserosal fibroid fundal right 1.7 x 2.4 x 1.3 cm,(#2) pedunculated fibroid left  3.3 x 3.3 x 2.7 cm  Technician Comments: PELVIC US TA/TV: anteverted uterus w/mult.fibroids (#1) subserosal fibroid fundal right 1.7 x 2.4 x 1.3 cm,(#2) pedunculated fibroid left  3.3 x 3.3 x 2.7 cm, EEC 7.9 mm,endometrium contains a 1.7 x .9 x 1.3 cm echogenic mass with a feeder vessel (? Polyp),normal ov's bilat,no free fluid seen,no pain,ov's appear mobile. Silver Huguenin 01/21/2016 10:23 AM Assessment:   Healthy well-woman exam Menometrorrhagia Uterine fibroids ?endometrial polyp STD screening Smoker  Plan:  HIV, RPR, Hep B today per pt request Discussed u/s w/ [facility] who recommends hysteroscopy/D&C/ablation Discussed u/s and recommendations w/ pt, who is interested in surgical intervention but concerned d/t urinary incontinence after 2 surgeries w/ general anesthesia F/U w/in next few weeks for pre-op w/ [facility], or sooner if needed Mammogram- call to schedule screening mammo now Colonoscopy @45yo  or sooner if problems Advised smoking cessation, gave 1-800-quit-now #  Tawnya Crook CNM, [facility] 01/30/2016 10:46 AM

## 2016-01-30 NOTE — Patient Instructions (Signed)
Schedule your mammogram  1-800-quit-now  Smoking Cessation, Tips for Success If you are ready to quit smoking, congratulations! You have chosen to help yourself be healthier. Cigarettes bring nicotine, tar, carbon monoxide, and other irritants into your body. Your lungs, heart, and blood vessels will be able to work better without these poisons. There are many different ways to quit smoking. Nicotine gum, nicotine patches, a nicotine inhaler, or nicotine nasal spray can help with physical craving. Hypnosis, support groups, and medicines help break the habit of smoking. WHAT THINGS CAN I DO TO MAKE QUITTING EASIER?  Here are some tips to help you quit for good:  Pick a date when you will quit smoking completely. Tell all of your friends and family about your plan to quit on that date.  Do not try to slowly cut down on the number of cigarettes you are smoking. Pick a quit date and quit smoking completely starting on that day.  Throw away all cigarettes.   Clean and remove all ashtrays from your home, work, and car.  On a card, write down your reasons for quitting. Carry the card with you and read it when you get the urge to smoke.  Cleanse your body of nicotine. Drink enough water and fluids to keep your urine clear or pale yellow. Do this after quitting to flush the nicotine from your body.  Learn to predict your moods. Do not let a bad situation be your excuse to have a cigarette. Some situations in your life might tempt you into wanting a cigarette.  Never have "just one" cigarette. It leads to wanting another and another. Remind yourself of your decision to quit.  Change habits associated with smoking. If you smoked while driving or when feeling stressed, try other activities to replace smoking. Stand up when drinking your coffee. Brush your teeth after eating. Sit in a different chair when you read the paper. Avoid alcohol while trying to quit, and try to drink fewer caffeinated  beverages. Alcohol and caffeine may urge you to smoke.  Avoid foods and drinks that can trigger a desire to smoke, such as sugary or spicy foods and alcohol.  Ask people who smoke not to smoke around you.  Have something planned to do right after eating or having a cup of coffee. For example, plan to take a walk or exercise.  Try a relaxation exercise to calm you down and decrease your stress. Remember, you may be tense and nervous for the first 2 weeks after you quit, but this will pass.  Find new activities to keep your hands busy. Play with a pen, coin, or rubber band. Doodle or draw things on paper.  Brush your teeth right after eating. This will help cut down on the craving for the taste of tobacco after meals. You can also try mouthwash.   Use oral substitutes in place of cigarettes. Try using lemon drops, carrots, cinnamon sticks, or chewing gum. Keep them handy so they are available when you have the urge to smoke.  When you have the urge to smoke, try deep breathing.  Designate your home as a nonsmoking area.  If you are a heavy smoker, ask your health care provider about a prescription for nicotine chewing gum. It can ease your withdrawal from nicotine.  Reward yourself. Set aside the cigarette money you save and buy yourself something nice.  Look for support from others. Join a support group or smoking cessation program. Ask someone at home or at work  to help you with your plan to quit smoking.  Always ask yourself, "Do I need this cigarette or is this just a reflex?" Tell yourself, "Today, I choose not to smoke," or "I do not want to smoke." You are reminding yourself of your decision to quit.  Do not replace cigarette smoking with electronic cigarettes (commonly called e-cigarettes). The safety of e-cigarettes is unknown, and some may contain harmful chemicals.  If you relapse, do not give up! Plan ahead and think about what you will do the next time you get the urge to  smoke. HOW WILL I FEEL WHEN I QUIT SMOKING? You may have symptoms of withdrawal because your body is used to nicotine (the addictive substance in cigarettes). You may crave cigarettes, be irritable, feel very hungry, cough often, get headaches, or have difficulty concentrating. The withdrawal symptoms are only temporary. They are strongest when you first quit but will go away within 10-14 days. When withdrawal symptoms occur, stay in control. Think about your reasons for quitting. Remind yourself that these are signs that your body is healing and getting used to being without cigarettes. Remember that withdrawal symptoms are easier to treat than the major diseases that smoking can cause.  Even after the withdrawal is over, expect periodic urges to smoke. However, these cravings are generally short lived and will go away whether you smoke or not. Do not smoke! WHAT RESOURCES ARE AVAILABLE TO HELP ME QUIT SMOKING? Your health care provider can direct you to community resources or hospitals for support, which may include:  Group support.  Education.  Hypnosis.  Therapy.   This information is not intended to replace advice given to you by your health care provider. Make sure you discuss any questions you have with your health care provider.   Document Released: 12/05/2003 Document Revised: 03/29/2014 Document Reviewed: 08/24/2012 Elsevier Interactive Patient Education [employer].

## 2016-02-04 LAB — CYTOLOGY - PAP
ADEQUACY: ABSENT
CHLAMYDIA, DNA PROBE: NEGATIVE
DIAGNOSIS: NEGATIVE
HPV: NOT DETECTED
Neisseria Gonorrhea: NEGATIVE

## 2016-02-16 ENCOUNTER — Ambulatory Visit (INDEPENDENT_AMBULATORY_CARE_PROVIDER_SITE_OTHER): Payer: Worker's Compensation | Admitting: Family

## 2016-02-16 ENCOUNTER — Encounter: Payer: Self-pay | Admitting: Family

## 2016-02-16 VITALS — BP 131/86 | HR 78 | Temp 98.5°F | Ht 65.0 in | Wt 185.0 lb

## 2016-02-16 DIAGNOSIS — S91115D Laceration without foreign body of left lesser toe(s) without damage to nail, subsequent encounter: Secondary | ICD-10-CM

## 2016-02-16 DIAGNOSIS — S91115A Laceration without foreign body of left lesser toe(s) without damage to nail, initial encounter: Secondary | ICD-10-CM

## 2016-02-16 DIAGNOSIS — S90122D Contusion of left lesser toe(s) without damage to nail, subsequent encounter: Secondary | ICD-10-CM

## 2016-02-16 DIAGNOSIS — S90122A Contusion of left lesser toe(s) without damage to nail, initial encounter: Secondary | ICD-10-CM

## 2016-02-16 NOTE — Progress Notes (Signed)
   Subjective:    Patient ID: Robin Baldwin, female    DOB: 1971-02-17, 45 y.o.   MRN: [id number]  HPI PT presents to the office today for Workers Comp injury follow up that occurred on 02/10/16. PT states a about a 15-20 lb metal rod dropped on her left pinky toe. PT was seen at [facility] and had a negative x-ray. Pt states she continues to have intermittent throbbing/stiff pain of 4 out 10. PT has taken tylenol and motrin prn for pain that helps. Pt was placed on Keflex 500 mg. Pt denies any discharge.   Review of Systems  All other systems reviewed and are negative.      Objective:   Physical Exam  Constitutional: She is oriented to person, place, and time. She appears well-developed and well-nourished. No distress.  HENT:  Head: Normocephalic.  Cardiovascular: Normal rate, regular rhythm, normal heart sounds and intact distal pulses.   No murmur heard. Pulmonary/Chest: Effort normal and breath sounds normal. No respiratory distress. She has no wheezes.  Abdominal: Soft. Bowel sounds are normal. She exhibits no distension. There is no tenderness.  Musculoskeletal: Normal range of motion. She exhibits no edema or tenderness.  Neurological: She is alert and oriented to person, place, and time.  Skin: Skin is warm and dry. Laceration (healing laceration on eft medial pinky toe, no discharge or erythemas present) noted.  Psychiatric: She has a normal mood and affect. Her behavior is normal. Judgment and thought content normal.  Vitals reviewed.    BP 131/86   Pulse 78   Temp 98.5 F (36.9 C) (Oral)   Ht 5\' 5"  (1.651 m)   Wt 185 lb (83.9 kg)   LMP 01/16/2016   BMI 30.79 kg/m       Assessment & Plan:  1. Laceration of lesser toe of left foot without foreign body present or damage to nail, subsequent encounter  2. Contusion of lesser toe of left foot without damage to nail, subsequent encounter  Keep clean and dry Report any redness, drainage, or warmth Return to work  RTO  prn   Evelina Dun, FNP

## 2016-02-16 NOTE — Patient Instructions (Signed)
Laceration Care, Adult Introduction A laceration is a cut that goes through all layers of the skin. The cut also goes into the tissue that is right under the skin. Some cuts heal on their own. Others need to be closed with stitches (sutures), staples, skin adhesive strips, or wound glue. Taking care of your cut lowers your risk of infection and helps your cut to heal better. How to take care of your cut For stitches or staples:  Keep the wound clean and dry.  If you were given a bandage (dressing), you should change it at least one time per day or as told by your doctor. You should also change it if it gets wet or dirty.  Keep the wound completely dry for the first 24 hours or as told by your doctor. After that time, you may take a shower or a bath. However, make sure that the wound is not soaked in water until after the stitches or staples have been removed.  Clean the wound one time each day or as told by your doctor:  Wash the wound with soap and water.  Rinse the wound with water until all of the soap comes off.  Pat the wound dry with a clean towel. Do not rub the wound.  After you clean the wound, put a thin layer of antibiotic ointment on it as told by your doctor. This ointment:  Helps to prevent infection.  Keeps the bandage from sticking to the wound.  Have your stitches or staples removed as told by your doctor. If your doctor used skin adhesive strips:  Keep the wound clean and dry.  If you were given a bandage, you should change it at least one time per day or as told by your doctor. You should also change it if it gets dirty or wet.  Do not get the skin adhesive strips wet. You can take a shower or a bath, but be careful to keep the wound dry.  If the wound gets wet, pat it dry with a clean towel. Do not rub the wound.  Skin adhesive strips fall off on their own. You can trim the strips as the wound heals. Do not remove any strips that are still stuck to the wound.  They will fall off after a while. If your doctor used wound glue:  Try to keep your wound dry, but you may briefly wet it in the shower or bath. Do not soak the wound in water, such as by swimming.  After you take a shower or a bath, gently pat the wound dry with a clean towel. Do not rub the wound.  Do not do any activities that will make you really sweaty until the skin glue has fallen off on its own.  Do not apply liquid, cream, or ointment medicine to your wound while the skin glue is still on.  If you were given a bandage, you should change it at least one time per day or as told by your doctor. You should also change it if it gets dirty or wet.  If a bandage is placed over the wound, do not let the tape for the bandage touch the skin glue.  Do not pick at the glue. The skin glue usually stays on for 5-10 days. Then, it falls off of the skin. General Instructions  To help prevent scarring, make sure to cover your wound with sunscreen whenever you are outside after stitches are removed, after adhesive strips are removed,   or when wound glue stays in place and the wound is healed. Make sure to wear a sunscreen of at least 30 SPF.  Take over-the-counter and prescription medicines only as told by your doctor.  If you were given antibiotic medicine or ointment, take or apply it as told by your doctor. Do not stop using the antibiotic even if your wound is getting better.  Do not scratch or pick at the wound.  Keep all follow-up visits as told by your doctor. This is important.  Check your wound every day for signs of infection. Watch for:  Redness, swelling, or pain.  Fluid, blood, or pus.  Raise (elevate) the injured area above the level of your heart while you are sitting or lying down, if possible. Get help if:  You got a tetanus shot and you have any of these problems at the injection site:  Swelling.  Very bad pain.  Redness.  Bleeding.  You have a fever.  A wound  that was closed breaks open.  You notice a bad smell coming from your wound or your bandage.  You notice something coming out of the wound, such as wood or glass.  Medicine does not help your pain.  You have more redness, swelling, or pain at the site of your wound.  You have fluid, blood, or pus coming from your wound.  You notice a change in the color of your skin near your wound.  You need to change the bandage often because fluid, blood, or pus is coming from the wound.  You start to have a new rash.  You start to have numbness around the wound. Get help right away if:  You have very bad swelling around the wound.  Your pain suddenly gets worse and is very bad.  You notice painful lumps near the wound or on skin that is anywhere on your body.  You have a red streak going away from your wound.  The wound is on your hand or foot and you cannot move a finger or toe like you usually can.  The wound is on your hand or foot and you notice that your fingers or toes look pale or bluish. This information is not intended to replace advice given to you by your health care provider. Make sure you discuss any questions you have with your health care provider. Document Released: 08/25/2007 Document Revised: 08/14/2015 Document Reviewed: 03/04/2014  2017 Elsevier  

## 2016-02-19 ENCOUNTER — Encounter: Payer: BLUE CROSS/BLUE SHIELD | Admitting: Obstetrics & Gynecology

## 2016-02-23 ENCOUNTER — Encounter: Payer: BLUE CROSS/BLUE SHIELD | Admitting: Obstetrics & Gynecology

## 2016-03-29 ENCOUNTER — Encounter: Payer: BLUE CROSS/BLUE SHIELD | Admitting: Obstetrics & Gynecology

## 2017-12-13 IMAGING — DX DG CHEST 2V
2 series · 2 of 2 positions shown · non-contrast
Comparison: 02/08/2015

CLINICAL DATA: Cough, dizziness and hypertension

EXAM:
CHEST  2 VIEW

[chest pa]
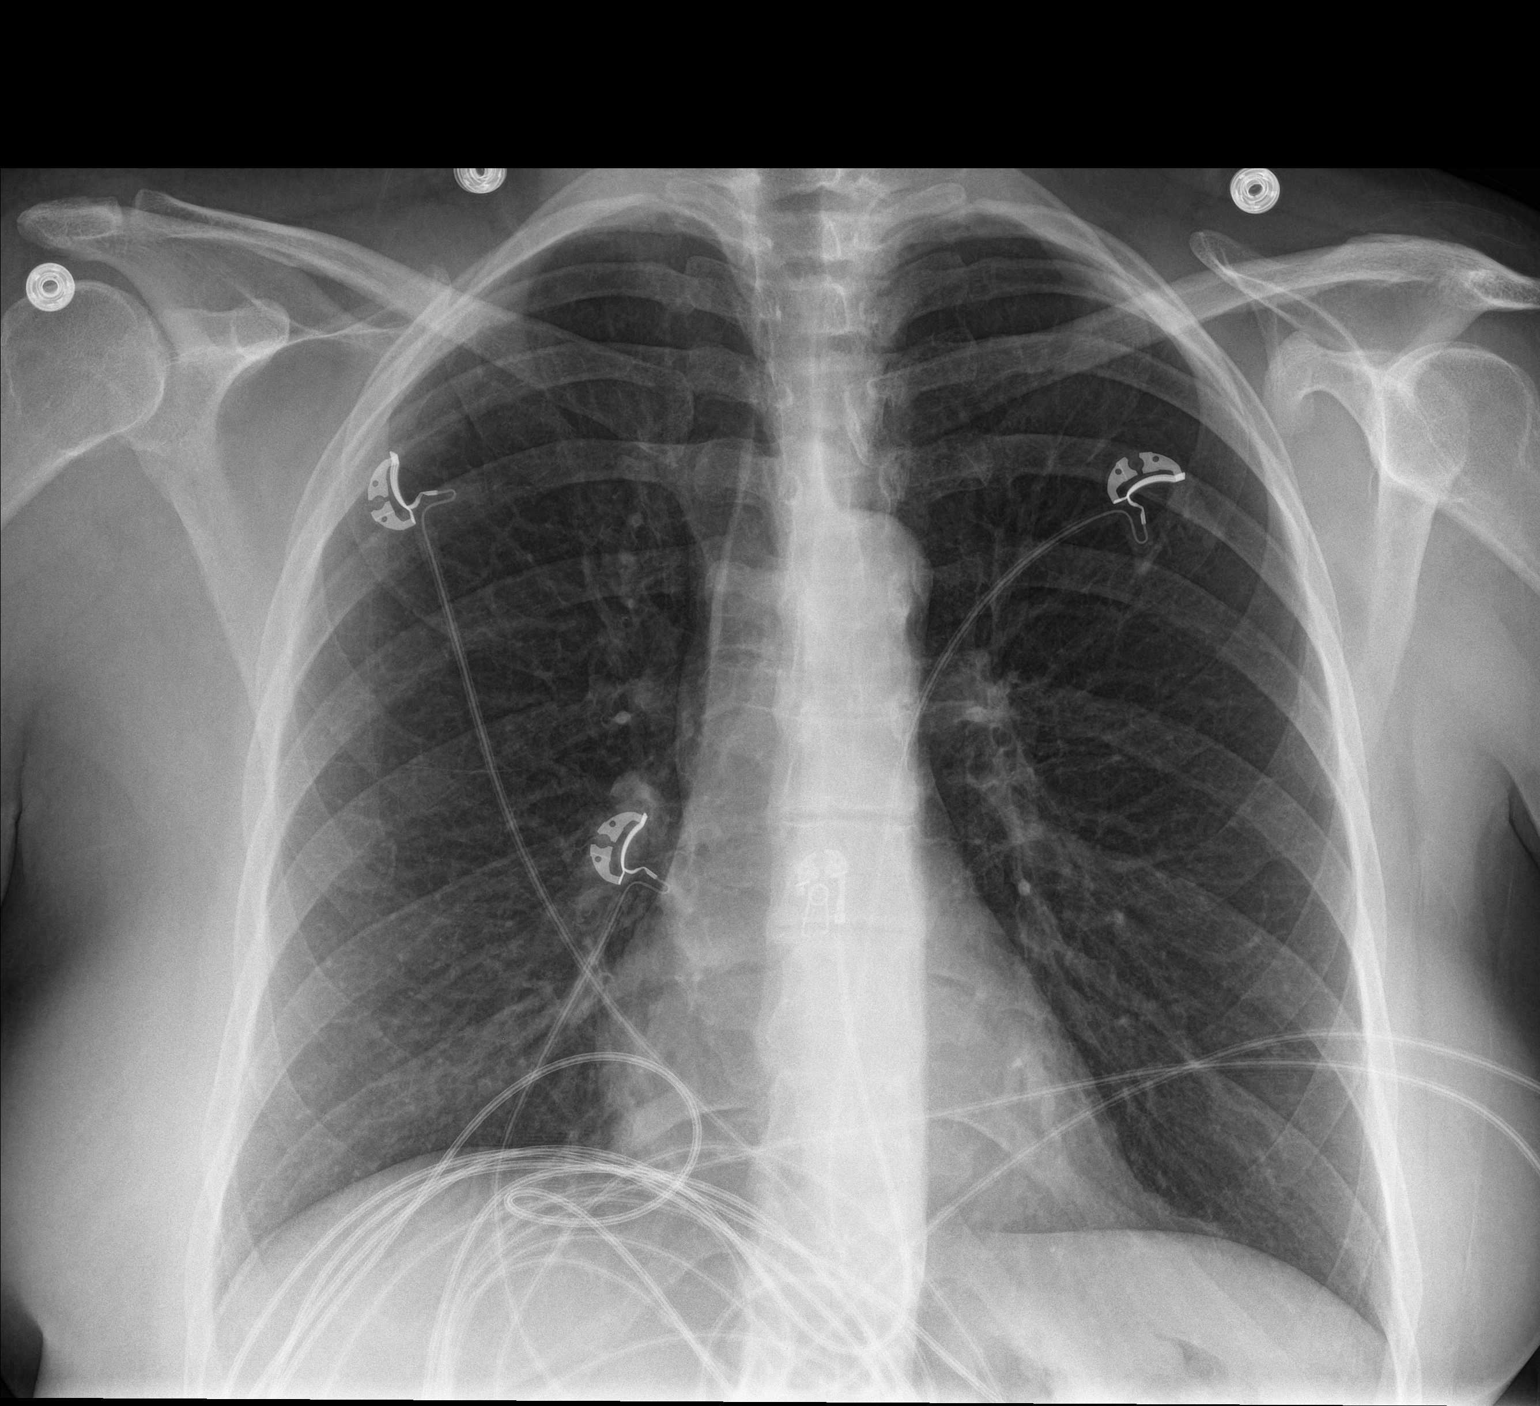

[chest lat]
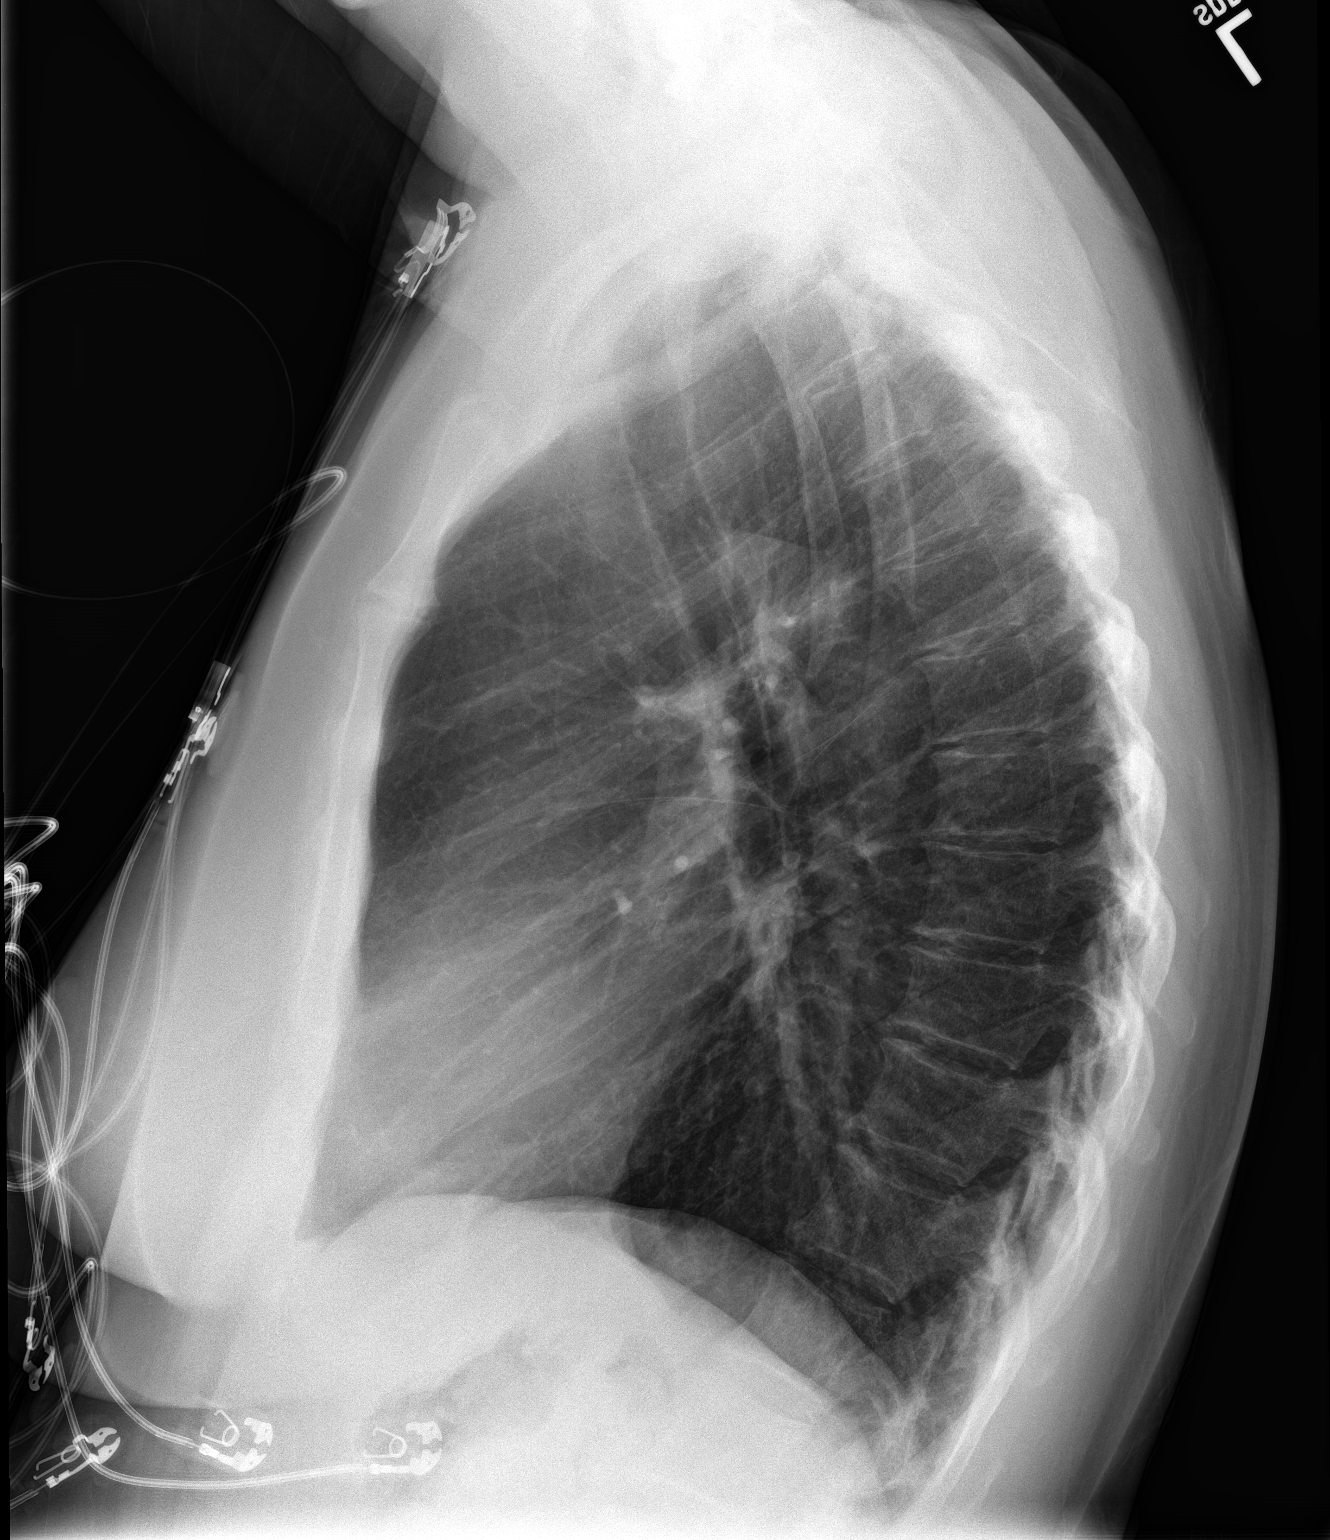

[2 of 2 positions shown; findings below may reference images not displayed]

FINDINGS: The lungs are slightly hyperinflated. No pneumonic consolidation,
CHF, effusion or pneumothorax. The cardiac mediastinal contours are
within normal limits and stable. No acute osseous abnormality.
IMPRESSION: Hyperinflated appearance of the lungs.

## 2018-09-27 ENCOUNTER — Emergency Department (HOSPITAL_COMMUNITY)
Admission: EM | Admit: 2018-09-27 | Discharge: 2018-09-27 | Disposition: A | Discharge status: Home / Self Care | Payer: Self-pay | Attending: Emergency Medicine | Admitting: Emergency Medicine

## 2018-09-27 ENCOUNTER — Other Ambulatory Visit: Payer: Self-pay

## 2018-09-27 ENCOUNTER — Emergency Department (HOSPITAL_COMMUNITY): Payer: Self-pay

## 2018-09-27 ENCOUNTER — Encounter (HOSPITAL_COMMUNITY): Payer: Self-pay

## 2018-09-27 DIAGNOSIS — R079 Chest pain, unspecified: Secondary | ICD-10-CM | POA: Insufficient documentation

## 2018-09-27 DIAGNOSIS — F17210 Nicotine dependence, cigarettes, uncomplicated: Secondary | ICD-10-CM | POA: Insufficient documentation

## 2018-09-27 DIAGNOSIS — Z8249 Family history of ischemic heart disease and other diseases of the circulatory system: Secondary | ICD-10-CM | POA: Insufficient documentation

## 2018-09-27 DIAGNOSIS — Z79899 Other long term (current) drug therapy: Secondary | ICD-10-CM | POA: Insufficient documentation

## 2018-09-27 DIAGNOSIS — J449 Chronic obstructive pulmonary disease, unspecified: Secondary | ICD-10-CM | POA: Insufficient documentation

## 2018-09-27 LAB — CBC
HCT: 35.7 % — ABNORMAL LOW (ref 36.0–46.0)
Hemoglobin: 11.9 g/dL — ABNORMAL LOW (ref 12.0–15.0)
MCH: 30.6 pg (ref 26.0–34.0)
MCHC: 33.3 g/dL (ref 30.0–36.0)
MCV: 91.8 fL (ref 80.0–100.0)
Platelets: 229 10*3/uL (ref 150–400)
RBC: 3.89 MIL/uL (ref 3.87–5.11)
RDW: 11.6 % (ref 11.5–15.5)
WBC: 7.4 10*3/uL (ref 4.0–10.5)
nRBC: 0 % (ref 0.0–0.2)

## 2018-09-27 LAB — BASIC METABOLIC PANEL
Anion gap: 8 (ref 5–15)
BUN: 9 mg/dL (ref 6–20)
CO2: 25 mmol/L (ref 22–32)
Calcium: 8.8 mg/dL — ABNORMAL LOW (ref 8.9–10.3)
Chloride: 101 mmol/L (ref 98–111)
Creatinine, Ser: 0.58 mg/dL (ref 0.44–1.00)
GFR calc Af Amer: >60 mL/min (ref >60)
GFR calc non Af Amer: >60 mL/min (ref >60)
Glucose, Bld: 99 mg/dL (ref 70–99)
Potassium: 3.6 mmol/L (ref 3.5–5.1)
Sodium: 134 mmol/L — ABNORMAL LOW (ref 135–145)

## 2018-09-27 LAB — POC URINE PREG, ED: Preg Test, Ur: NEGATIVE

## 2018-09-27 LAB — TROPONIN I (HIGH SENSITIVITY)
Troponin I (High Sensitivity): <2 ng/L (ref <18)
Troponin I (High Sensitivity): <2 ng/L (ref <18)

## 2018-09-27 MED ORDER — HYDROXYZINE HCL 25 MG PO TABS
25.0000 mg | ORAL_TABLET | Freq: Once | ORAL | Status: AC
  Administered 2018-09-27: 25 mg via ORAL
  Filled 2018-09-27: qty 1

## 2018-09-27 MED ORDER — LORAZEPAM 1 MG PO TABS
1.0000 mg | ORAL_TABLET | Freq: Once | ORAL | Status: AC
  Administered 2018-09-27: 1 mg via ORAL
  Filled 2018-09-27: qty 1

## 2018-09-27 MED ORDER — LORAZEPAM 1 MG PO TABS: 0.5000 mg | ORAL_TABLET | Freq: Three times a day (TID) | ORAL | 0 refills | Status: DC | PRN

## 2018-09-27 MED ORDER — ASPIRIN 81 MG PO CHEW
324.0000 mg | CHEWABLE_TABLET | Freq: Once | ORAL | Status: AC
  Administered 2018-09-27: 324 mg via ORAL
  Filled 2018-09-27: qty 4

## 2018-09-27 NOTE — Discharge Instructions (Addendum)
You were seen in the emergency department today for chest pain. Your work-up in the emergency department has been overall reassuring. Your labs have been fairly normal and or similar to previous blood work you have had done- your hemoglobin, calcium & sodium were all a bit low- please have this rechecked by your primary care provider. Your EKG and the enzyme we use to check your heart did not show an acute heart attack at this time. Your chest x-ray was normal.   We are sending you home with a short course of ativan- this is a medicine similar to your xanax. Please take a 1/2 a tablet or 1 tablet only as needed for anxiety every 8 hours. This is a controlled substance medication that has addictive potential. Do not drive or operate heavy machinery when taking this medicine as it can be sedating. Do not drink alcohol or take other sedating medications when taking this medicine for safety reasons.  Keep this out of reach of small children.   We have prescribed you new medication(s) today. Discuss the medications prescribed today with your pharmacist as they can have adverse effects and interactions with your other medicines including over the counter and prescribed medications. Seek medical evaluation if you start to experience new or abnormal symptoms after taking one of these medicines, seek care immediately if you start to experience difficulty breathing, feeling of your throat closing, facial swelling, or rash as these could be indications of a more serious allergic reaction  We would like you to follow up closely with your primary care provider and the cardiologist provided in your discharge instructions within 1-3 days. Return to the ER immediately should you experience any new or worsening symptoms including but not limited to return of pain, worsened pain, vomiting, shortness of breath, dizziness, lightheadedness, passing out, or any other concerns that you may have.

## 2018-09-27 NOTE — ED Triage Notes (Signed)
Pt had episode of central CP pain that has been going off and on for several days. Pt reports increase stress due to caring for mother. EMS reports NSR

## 2018-09-27 NOTE — ED Provider Notes (Signed)
[facility] EMERGENCY DEPARTMENT Provider Note   CSN: [id number] Arrival date & time: 09/27/18  1824     History   Chief Complaint Chief Complaint  Patient presents with  . Chest Pain    HPI Robin Baldwin is a 48 y.o. female with a hx of tobacco abuse, COPD, & prior tubal ligation & cholecystectomy who presents to the ED via EMS with complaints of chest pain x 5 days. Patient reports pain is located in the L chest, is non-radiating, feels like pressure, & was initially coming and going (lasting a few hours at a time) & became constant today. Pain is associated w/ dyspnea, palpitations, nausea, & anxiety. Sxs are worse with increased stress this includes overextending herself mentally & physically. She states she is caring for her terminally ill mother who lives with her, she is working 50-60 hour weeks, & is not sleeping enough (5-6 hours per night). Sxs feel like prior panic attacks. She used to take PRN xanax but PCP stopped this about 1 year ago due to starting her on oxycodone related to chronic pain. She denies diaphoresis, dizziness, syncope, emesis,  leg pain/swelling, hemoptysis, recent surgery/trauma, recent long travel, hormone use, personal hx of cancer, or hx of DVT/PE. Denies EtOH or illicit drug use.  She does have family hx of early CAD- father died of MI @ age 4 & brother had MI in his 43s.          HPI  Past Medical History:  Diagnosis Date  . COPD (chronic obstructive pulmonary disease) [facility])     Patient Active Problem List   Diagnosis Date Noted  . Uterine fibroid 01/30/2016  . Endometrial polyp 01/30/2016  . Smoker 01/30/2016  . COPD (chronic obstructive pulmonary disease) ([place]) 01/30/2016  . Menometrorrhagia 12/29/2015    Past Surgical History:  Procedure Laterality Date  . CHOLECYSTECTOMY    . DILATION AND CURETTAGE OF UTERUS    . TUBAL LIGATION       OB History    Gravida  2   Para      Term      Preterm      AB  2   Living  0     SAB  2   TAB      Ectopic      Multiple      Live Births               Home Medications    Prior to Admission medications   Medication Sig Start Date End Date Taking? Authorizing Provider  ALPRAZolam Duanne Moron) 0.5 MG tablet Take 1 tablet by mouth 2 (two) times daily as needed. For anxiety 09/21/13   [provider]  brompheniramine-pseudoephedrine-DM 30-2-10 MG/5ML syrup Take 5 mLs by mouth 4 (four) times daily as needed (for cough).  01/05/16   [provider]  cephALEXin (KEFLEX) 500 MG capsule  02/11/16   [provider]  cyclobenzaprine (FLEXERIL) 10 MG tablet Take 10 mg by mouth 3 (three) times daily as needed for muscle spasms.  10/23/15   [provider]  ENDOCET 10-325 MG per tablet Take 0.5-1 tablets by mouth every 6 (six) hours as needed. For pain 09/18/13   [provider]  Ferrous Gluconate (IRON 27 PO) Take by mouth.    [provider]  Multiple Vitamin (MULTIVITAMIN) tablet Take 1 tablet by mouth daily.    [provider]  Vitamin D, Ergocalciferol, (DRISDOL) 50000 units CAPS capsule Take 1 capsule by mouth  2 (two) times a week. 01/02/16   [provider]    Family History Family History  Problem Relation Age of Onset  . Heart attack Father   . Heart failure Mother   . Drug abuse Brother        drug over dose  . Heart attack Brother     Social History Social History   Tobacco Use  . Smoking status: Current Every Day Smoker    Packs/day: 1.00    Types: Cigarettes  . Smokeless tobacco: Never Used  Substance Use Topics  . Alcohol use: No  . Drug use: No     Allergies   Sulfur, Clindamycin/lincomycin, and Codeine   Review of Systems Review of Systems  Constitutional: Negative for chills, diaphoresis and fever.  Respiratory: Positive for shortness of breath.   Cardiovascular: Positive for chest pain and palpitations. Negative for leg swelling.  Gastrointestinal: Positive for  nausea. Negative for abdominal pain, constipation, diarrhea and vomiting.  Genitourinary: Negative for dysuria.  Neurological: Negative for syncope, weakness and numbness.  Psychiatric/Behavioral: Positive for sleep disturbance. Negative for hallucinations, self-injury and suicidal ideas. The patient is nervous/anxious.   All other systems reviewed and are negative.    Physical Exam Updated Vital Signs Ht 5\' 6"  (1.676 m)   Wt 72.6 kg   LMP 09/20/2018   BMI 25.82 kg/m   Physical Exam Vitals signs and nursing note reviewed.  Constitutional:      General: She is not in acute distress.    Appearance: She is well-developed. She is not toxic-appearing.  HENT:     Head: Normocephalic and atraumatic.  Eyes:     General:        Right eye: No discharge.        Left eye: No discharge.     Conjunctiva/sclera: Conjunctivae normal.  Neck:     Musculoskeletal: Neck supple.  Cardiovascular:     Rate and Rhythm: Normal rate and regular rhythm.     Pulses:          Radial pulses are 2+ on the right side and 2+ on the left side.       Posterior tibial pulses are 2+ on the right side and 2+ on the left side.  Pulmonary:     Effort: Pulmonary effort is normal. No respiratory distress.     Breath sounds: Normal breath sounds. No wheezing, rhonchi or rales.  Abdominal:     General: There is no distension.     Palpations: Abdomen is soft.     Tenderness: There is no abdominal tenderness.  Musculoskeletal:     Right lower leg: She exhibits no tenderness. No edema.     Left lower leg: She exhibits no tenderness. No edema.  Skin:    General: Skin is warm and dry.     Findings: No rash.  Neurological:     General: No focal deficit present.     Mental Status: She is alert.     Comments: Clear speech.   Psychiatric:        Behavior: Behavior normal.    ED Treatments / Results  Labs (all labs ordered are listed, but only abnormal results are displayed) Labs Reviewed  BASIC METABOLIC  PANEL - Abnormal; Notable for the following components:      Result Value   Sodium 134 (*)    Calcium 8.8 (*)    All other components within normal limits  CBC - Abnormal; Notable for the following components:  Hemoglobin 11.9 (*)    HCT 35.7 (*)    All other components within normal limits  TROPONIN I (HIGH SENSITIVITY)  TROPONIN I (HIGH SENSITIVITY)  POC URINE PREG, ED    EKG EKG Interpretation  Date/Time:  Wednesday September 27 2018 18:45:25 EDT Ventricular Rate:  77 PR Interval:    QRS Duration: 88 QT Interval:  391 QTC Calculation: 443 R Axis:   66 Text Interpretation:  Sinus rhythm Probable left atrial enlargement Low voltage, precordial leads Confirmed by Nat Christen [id number]) on 09/27/2018 8:41:56 PM   Radiology Dg Chest 2 View  Result Date: 09/27/2018 CLINICAL DATA:  Cough and chest pain EXAM: CHEST - 2 VIEW COMPARISON:  December 11, 2016 FINDINGS: No edema or consolidation. Heart size and pulmonary vascularity are normal. No adenopathy. No pneumothorax. No bone lesions. IMPRESSION: No edema or consolidation. Electronically Signed   By: Lowella Grip III M.D.   On: 09/27/2018 19:43    Procedures Procedures (including critical care time)  Medications Ordered in ED Medications  LORazepam (ATIVAN) tablet 1 mg (has no administration in time range)  aspirin chewable tablet 324 mg (324 mg Oral Given 09/27/18 1917)  hydrOXYzine (ATARAX/VISTARIL) tablet 25 mg (25 mg Oral Given 09/27/18 1917)     Initial Impression / Assessment and Plan / ED Course  I have reviewed the triage vital signs and the nursing notes.  Pertinent labs & imaging results that were available during my care of the patient were reviewed by me and considered in my medical decision making (see chart for details).    Patient presents to the emergency department with chest pain. Patient nontoxic appearing, in no apparent distress, vitals without significant abnormality. Fairly benign physical exam. DDX: ACS,  pulmonary embolism, dissection, pneumothorax, effusion, infiltrate, arrhythmia, anemia, electrolyte derangement, MSK, GERD, anxiety. Evaluation initiated with labs, EKG, and CXR. Patient on cardiac monitor.   Work-up in the ER reviewed:  CBC: no leukocytosis, mild anemia BMP: Mild hyponatremia & hypocalcemia- no significant electrolyte derangement.  Troponin: Negative x 2 EKG: No STEMI of significant ischemic changes.  CXR:  Negative, without infiltrate, effusion, pneumothorax, or fracture/dislocation.   Low risk heart score of 3, EKG without obvious ischemia, delta high sensitivity troponin negative, doubt ACS. Patient is low risk wells, PERC negative, doubt pulmonary embolism. Pain is not a tearing sensation, symmetric pulses, no widening of mediastinum on CXR, doubt dissection. Cardiac monitor reviewed, no notable arrhythmias or tachycardia. Patient has appeared hemodynamically stable throughout ER visit and appears safe for discharge with close cardiology follow up especially given her family history of CAD. Will provide a few tablets of lorazepam to assist patient with her stress & sleep- we discussed addictive potential of this medicine as well as safety with her already prescribed percocet. [place] Controlled Substance reporting System queried. Consult also placed to case management for respite care. I discussed results, treatment plan, need for follow-up, and return precautions with the patient. Provided opportunity for questions, patient confirmed understanding and is in agreement with plan.     Findings and plan of care discussed with supervising physician Dr. Lacinda Axon who has evaluated patient & is in agreement.   Final Clinical Impressions(s) / ED Diagnoses   Final diagnoses:  Chest pain, unspecified type    ED Discharge Orders         Ordered    LORazepam (ATIVAN) 1 MG tablet  3 times daily PRN     09/27/18 2159  Leafy Kindle 09/27/18 2201     Nat Christen, MD 09/28/18 2208

## 2018-12-28 ENCOUNTER — Other Ambulatory Visit: Payer: Self-pay

## 2018-12-28 DIAGNOSIS — Z20822 Contact with and (suspected) exposure to covid-19: Secondary | ICD-10-CM

## 2018-12-30 LAB — NOVEL CORONAVIRUS, NAA: SARS-CoV-2, NAA: NOT DETECTED

## 2019-03-20 ENCOUNTER — Other Ambulatory Visit (HOSPITAL_COMMUNITY): Payer: Self-pay | Admitting: Internal Medicine

## 2019-03-20 DIAGNOSIS — Z1231 Encounter for screening mammogram for malignant neoplasm of breast: Secondary | ICD-10-CM

## 2019-04-24 ENCOUNTER — Emergency Department (HOSPITAL_COMMUNITY): Payer: BC Managed Care – PPO

## 2019-04-24 ENCOUNTER — Encounter (HOSPITAL_COMMUNITY): Payer: Self-pay | Admitting: *Deleted

## 2019-04-24 ENCOUNTER — Other Ambulatory Visit: Payer: Self-pay

## 2019-04-24 ENCOUNTER — Emergency Department (HOSPITAL_COMMUNITY)
Admission: EM | Admit: 2019-04-24 | Discharge: 2019-04-24 | Disposition: A | Discharge status: Home / Self Care | Payer: BC Managed Care – PPO | Attending: Emergency Medicine | Admitting: Emergency Medicine

## 2019-04-24 DIAGNOSIS — Z7982 Long term (current) use of aspirin: Secondary | ICD-10-CM | POA: Diagnosis not present

## 2019-04-24 DIAGNOSIS — F17210 Nicotine dependence, cigarettes, uncomplicated: Secondary | ICD-10-CM | POA: Insufficient documentation

## 2019-04-24 DIAGNOSIS — Z20822 Contact with and (suspected) exposure to covid-19: Secondary | ICD-10-CM | POA: Insufficient documentation

## 2019-04-24 DIAGNOSIS — R0602 Shortness of breath: Secondary | ICD-10-CM | POA: Diagnosis present

## 2019-04-24 DIAGNOSIS — R519 Headache, unspecified: Secondary | ICD-10-CM | POA: Diagnosis not present

## 2019-04-24 DIAGNOSIS — R05 Cough: Secondary | ICD-10-CM | POA: Insufficient documentation

## 2019-04-24 DIAGNOSIS — J449 Chronic obstructive pulmonary disease, unspecified: Secondary | ICD-10-CM | POA: Insufficient documentation

## 2019-04-24 LAB — BASIC METABOLIC PANEL
Anion gap: 8 (ref 5–15)
BUN: 8 mg/dL (ref 6–20)
CO2: 23 mmol/L (ref 22–32)
Calcium: 8.5 mg/dL — ABNORMAL LOW (ref 8.9–10.3)
Chloride: 104 mmol/L (ref 98–111)
Creatinine, Ser: 0.61 mg/dL (ref 0.44–1.00)
GFR calc Af Amer: >60 mL/min (ref >60)
GFR calc non Af Amer: >60 mL/min (ref >60)
Glucose, Bld: 92 mg/dL (ref 70–99)
Potassium: 3.7 mmol/L (ref 3.5–5.1)
Sodium: 135 mmol/L (ref 135–145)

## 2019-04-24 LAB — CBC
HCT: 36.2 % (ref 36.0–46.0)
Hemoglobin: 11.9 g/dL — ABNORMAL LOW (ref 12.0–15.0)
MCH: 30.1 pg (ref 26.0–34.0)
MCHC: 32.9 g/dL (ref 30.0–36.0)
MCV: 91.4 fL (ref 80.0–100.0)
Platelets: 238 10*3/uL (ref 150–400)
RBC: 3.96 MIL/uL (ref 3.87–5.11)
RDW: 13.1 % (ref 11.5–15.5)
WBC: 5.5 10*3/uL (ref 4.0–10.5)
nRBC: 0 % (ref 0.0–0.2)

## 2019-04-24 NOTE — ED Triage Notes (Signed)
Pt with sob since the weekend, headache for over a week.

## 2019-04-24 NOTE — ED Provider Notes (Signed)
[facility] EMERGENCY DEPARTMENT Provider Note   CSN: [id number] Arrival date & time: 04/24/19  1759     History Chief Complaint  Patient presents with  . Shortness of Breath    Robin Baldwin is a 49 y.o. female.  HPI   Patient states for the last several days she has had some throat irritation as well as a cough.  She has also had a headache and dry mouth.  She has not noticed any specific change in her sense of taste or smell.  She is feeling somewhat short of breath as well.  Patient was at work today and was having a significant cough.  She was sent to the ED for evaluation.  No known Covid exposure.  Past Medical History:  Diagnosis Date  . COPD (chronic obstructive pulmonary disease) [facility])     Patient Active Problem List   Diagnosis Date Noted  . Uterine fibroid 01/30/2016  . Endometrial polyp 01/30/2016  . Smoker 01/30/2016  . COPD (chronic obstructive pulmonary disease) ([place]) 01/30/2016  . Menometrorrhagia 12/29/2015    Past Surgical History:  Procedure Laterality Date  . CHOLECYSTECTOMY    . DILATION AND CURETTAGE OF UTERUS    . TUBAL LIGATION       OB History    Gravida  2   Para      Term      Preterm      AB  2   Living  0     SAB  2   TAB      Ectopic      Multiple      Live Births              Family History  Problem Relation Age of Onset  . Heart attack Father   . Heart failure Mother   . Drug abuse Brother        drug over dose  . Heart attack Brother     Social History   Tobacco Use  . Smoking status: Current Every Day Smoker    Packs/day: 1.00    Types: Cigarettes  . Smokeless tobacco: Never Used  Substance Use Topics  . Alcohol use: No  . Drug use: No    Home Medications Prior to Admission medications   Medication Sig Start Date End Date Taking? Authorizing Provider  Acetaminophen (TYLENOL ARTHRITIS EXT RELIEF PO) Take 1 tablet by mouth daily as needed (for pain).   Yes [provider]  albuterol  (PROVENTIL) (2.5 MG/3ML) 0.083% nebulizer solution Take 2.5 mg by nebulization every 6 (six) hours as needed for wheezing or shortness of breath.   Yes [provider]  albuterol (VENTOLIN HFA) 108 (90 Base) MCG/ACT inhaler Inhale 1-2 puffs into the lungs every 6 (six) hours as needed for wheezing or shortness of breath.  11/23/18  Yes [provider]  aspirin EC 81 MG tablet Take 81 mg by mouth daily.   Yes [provider]  cyanocobalamin 100 MCG tablet Take 100 mcg by mouth daily.   Yes [provider]  Ferrous Gluconate (IRON 27 PO) Take 1 tablet by mouth See admin instructions. Takes daily during menstrual   Yes [provider]  Fluticasone-Umeclidin-Vilant (TRELEGY ELLIPTA) 100-62.5-25 MCG/INH AEPB Inhale 1 puff into the lungs daily.   Yes [provider]  Multiple Vitamin (MULTIVITAMIN) tablet Take 1 tablet by mouth daily.   Yes [provider]  oxyCODONE-acetaminophen (PERCOCET/ROXICET) 5-325 MG tablet Take 1 tablet by mouth 2 (two) times  a day.   Yes [provider]  pantoprazole (PROTONIX) 40 MG tablet Take 40 mg by mouth 2 (two) times daily. 04/19/19  Yes [provider]    Allergies    Sulfur, Benzonatate, Clindamycin/lincomycin, and Codeine  Review of Systems   Review of Systems  All other systems reviewed and are negative.   Physical Exam Updated Vital Signs BP (!) 152/76   Pulse 71   Temp 98.2 F (36.8 C) (Oral)   Ht 1.676 m (5\' 6" )   Wt 72.6 kg   SpO2 100%   BMI 25.82 kg/m   Physical Exam Vitals and nursing note reviewed.  Constitutional:      General: She is not in acute distress.    Appearance: She is well-developed.  HENT:     Head: Normocephalic and atraumatic.     Right Ear: External ear normal.     Left Ear: External ear normal.     Mouth/Throat:     Pharynx: No pharyngeal swelling or oropharyngeal exudate.  Eyes:     General: No scleral icterus.       Right eye: No  discharge.        Left eye: No discharge.     Conjunctiva/sclera: Conjunctivae normal.  Neck:     Trachea: No tracheal deviation.  Cardiovascular:     Rate and Rhythm: Normal rate and regular rhythm.  Pulmonary:     Effort: Pulmonary effort is normal. No respiratory distress.     Breath sounds: Normal breath sounds. No stridor. No wheezing or rales.  Abdominal:     General: Bowel sounds are normal. There is no distension.     Palpations: Abdomen is soft.     Tenderness: There is no abdominal tenderness. There is no guarding or rebound.  Musculoskeletal:        General: No tenderness.     Cervical back: Neck supple.  Skin:    General: Skin is warm and dry.     Findings: No rash.  Neurological:     Mental Status: She is alert.     Cranial Nerves: No cranial nerve deficit (no facial droop, extraocular movements intact, no slurred speech).     Sensory: No sensory deficit.     Motor: No abnormal muscle tone or seizure activity.     Coordination: Coordination normal.     ED Results / Procedures / Treatments   Labs (all labs ordered are listed, but only abnormal results are displayed) Labs Reviewed  CBC - Abnormal; Notable for the following components:      Result Value   Hemoglobin 11.9 (*)    All other components within normal limits  BASIC METABOLIC PANEL - Abnormal; Notable for the following components:   Calcium 8.5 (*)    All other components within normal limits  SARS CORONAVIRUS 2 (TAT 6-24 HRS)    EKG None  Radiology DG Chest Port 1 View  Result Date: 04/24/2019 CLINICAL DATA:  Cough EXAM: PORTABLE CHEST 1 VIEW COMPARISON:  September 27, 2018 FINDINGS: The heart size and mediastinal contours are within normal limits. Both lungs are clear. The visualized skeletal structures are unremarkable. IMPRESSION: No active disease. Electronically Signed   By: Prudencio Pair M.D.   On: 04/24/2019 19:03    Procedures Procedures (including critical care time)  Medications Ordered in  ED Medications - No data to display  ED Course  I have reviewed the triage vital signs and the nursing notes.  Pertinent labs & imaging results  that were available during my care of the patient were reviewed by me and considered in my medical decision making (see chart for details).  Clinical Course as of Apr 24 2131  Tue Apr 24, 2019  2100 Chest x-ray without signs of pneumonia.  Labs unremarkable.   [JK]    Clinical Course User Index [JK] Dorie Rank, MD   MDM Rules/Calculators/A&P                      Robin Baldwin was evaluated in Emergency Department on 04/24/2019 for the symptoms described in the history of present illness. She was evaluated in the context of the global COVID-19 pandemic, which necessitated consideration that the patient might be at risk for infection with the SARS-CoV-2 virus that causes COVID-19. Institutional protocols and algorithms that pertain to the evaluation of patients at risk for COVID-19 are in a state of rapid change based on information released by regulatory bodies including the CDC and federal and state organizations. These policies and algorithms were followed during the patient's care in the ED.  No pna.  No hypoxia.  Overall appears well.  Will dc home.  Covid pending.  Quarantine, isolation discussed.  Final Clinical Impression(s) / ED Diagnoses Final diagnoses:  Suspected 2019 novel coronavirus infection    Rx / DC Orders ED Discharge Orders    None       Dorie Rank, MD 04/24/19 2133

## 2019-04-24 NOTE — Discharge Instructions (Addendum)
Your test results should be available typically within 24 hours.  Please check your MyChart for results.  Stay home until you have the results.  If it is positive you will need to remain quarantined.

## 2019-04-25 LAB — SARS CORONAVIRUS 2 (TAT 6-24 HRS): SARS Coronavirus 2: NEGATIVE

## 2019-05-07 ENCOUNTER — Ambulatory Visit (HOSPITAL_COMMUNITY)
Admission: RE | Admit: 2019-05-07 | Discharge: 2019-05-07 | Disposition: A | Discharge status: Home / Self Care | Payer: BC Managed Care – PPO | Source: Ambulatory Visit | Attending: Internal Medicine | Admitting: Internal Medicine

## 2019-05-07 ENCOUNTER — Other Ambulatory Visit: Payer: Self-pay

## 2019-05-07 DIAGNOSIS — Z1231 Encounter for screening mammogram for malignant neoplasm of breast: Secondary | ICD-10-CM | POA: Insufficient documentation

## 2019-09-05 ENCOUNTER — Other Ambulatory Visit: Payer: Self-pay

## 2019-09-05 ENCOUNTER — Emergency Department (HOSPITAL_COMMUNITY)
Admission: EM | Admit: 2019-09-05 | Discharge: 2019-09-05 | Disposition: A | Discharge status: Home / Self Care | Payer: BC Managed Care – PPO | Attending: Emergency Medicine | Admitting: Emergency Medicine

## 2019-09-05 ENCOUNTER — Encounter (HOSPITAL_COMMUNITY): Payer: Self-pay | Admitting: Emergency Medicine

## 2019-09-05 DIAGNOSIS — F17210 Nicotine dependence, cigarettes, uncomplicated: Secondary | ICD-10-CM | POA: Insufficient documentation

## 2019-09-05 DIAGNOSIS — M79604 Pain in right leg: Secondary | ICD-10-CM | POA: Diagnosis present

## 2019-09-05 DIAGNOSIS — Z888 Allergy status to other drugs, medicaments and biological substances status: Secondary | ICD-10-CM | POA: Diagnosis not present

## 2019-09-05 DIAGNOSIS — Z79899 Other long term (current) drug therapy: Secondary | ICD-10-CM | POA: Diagnosis not present

## 2019-09-05 DIAGNOSIS — Z7951 Long term (current) use of inhaled steroids: Secondary | ICD-10-CM | POA: Diagnosis not present

## 2019-09-05 DIAGNOSIS — Z7982 Long term (current) use of aspirin: Secondary | ICD-10-CM | POA: Diagnosis not present

## 2019-09-05 DIAGNOSIS — Z882 Allergy status to sulfonamides status: Secondary | ICD-10-CM | POA: Diagnosis not present

## 2019-09-05 DIAGNOSIS — J449 Chronic obstructive pulmonary disease, unspecified: Secondary | ICD-10-CM | POA: Diagnosis not present

## 2019-09-05 MED ORDER — NAPROXEN 500 MG PO TABS: 500.0000 mg | ORAL_TABLET | Freq: Two times a day (BID) | ORAL | 0 refills | Status: DC

## 2019-09-05 MED ORDER — METHYLPREDNISOLONE 4 MG PO TBPK: ORAL_TABLET | ORAL | 0 refills | Status: DC

## 2019-09-05 NOTE — Discharge Instructions (Signed)
Take the naproxen for pain relief.  You may take your regular pain medications as well.  Do not take any extra Tylenol. Return tomorrow morning for an ultrasound of your leg. Do not start the Medrol until 10 days after taking your Covid vaccination.  Follow-up with your primary care physician for further evaluation of your leg pain.

## 2019-09-05 NOTE — ED Triage Notes (Signed)
Pt c/o right leg pain that started today. Pt states she got Robin Baldwin and Robin Baldwin vaccine Monday.

## 2019-09-05 NOTE — ED Provider Notes (Signed)
[facility] EMERGENCY DEPARTMENT Provider Note   CSN: [id number] Arrival date & time: 09/05/19  1831     History Chief Complaint  Patient presents with  . Leg Pain    Robin Baldwin is a 49 y.o. female who presents emergency department chief complaint of right leg pain.  Patient has a history of COPD, chronic pain.  She is on Percocet 10-3 25 daily.  She had her [place] vaccine on 09/03/2019.  She began having severe pain in her right lower leg down the medial side of her leg.  She does have a history of intermittent back pain.  She has never had anything like this.  She does not currently have back pain.  Patient became concerned that she might have a DVT.  She denies chest pain shortness of breath or fevers.  HPI     Past Medical History:  Diagnosis Date  . COPD (chronic obstructive pulmonary disease) [facility])     Patient Active Problem List   Diagnosis Date Noted  . Uterine fibroid 01/30/2016  . Endometrial polyp 01/30/2016  . Smoker 01/30/2016  . COPD (chronic obstructive pulmonary disease) ([place]) 01/30/2016  . Menometrorrhagia 12/29/2015    Past Surgical History:  Procedure Laterality Date  . CHOLECYSTECTOMY    . DILATION AND CURETTAGE OF UTERUS    . TUBAL LIGATION       OB History    Gravida  2   Para      Term      Preterm      AB  2   Living  0     SAB  2   TAB      Ectopic      Multiple      Live Births              Family History  Problem Relation Age of Onset  . Heart attack Father   . Heart failure Mother   . Drug abuse Brother        drug over dose  . Heart attack Brother     Social History   Tobacco Use  . Smoking status: Current Every Day Smoker    Packs/day: 1.00    Types: Cigarettes  . Smokeless tobacco: Never Used  Substance Use Topics  . Alcohol use: Yes  . Drug use: No    Home Medications Prior to Admission medications   Medication Sig Start Date End Date Taking? Authorizing Provider   Acetaminophen (TYLENOL ARTHRITIS EXT RELIEF PO) Take 1 tablet by mouth daily as needed (for pain).    [provider]  albuterol (PROVENTIL) (2.5 MG/3ML) 0.083% nebulizer solution Take 2.5 mg by nebulization every 6 (six) hours as needed for wheezing or shortness of breath.    [provider]  albuterol (VENTOLIN HFA) 108 (90 Base) MCG/ACT inhaler Inhale 1-2 puffs into the lungs every 6 (six) hours as needed for wheezing or shortness of breath.  11/23/18   [provider]  aspirin EC 81 MG tablet Take 81 mg by mouth daily.    [provider]  cyanocobalamin 100 MCG tablet Take 100 mcg by mouth daily.    [provider]  Ferrous Gluconate (IRON 27 PO) Take 1 tablet by mouth See admin instructions. Takes daily during menstrual    [provider]  Fluticasone-Umeclidin-Vilant (TRELEGY ELLIPTA) 100-62.5-25 MCG/INH AEPB Inhale 1 puff into the lungs daily.    [provider]  Multiple Vitamin (MULTIVITAMIN) tablet Take 1 tablet  by mouth daily.    [provider]  oxyCODONE-acetaminophen (PERCOCET/ROXICET) 5-325 MG tablet Take 1 tablet by mouth 2 (two) times a day.    [provider]  pantoprazole (PROTONIX) 40 MG tablet Take 40 mg by mouth 2 (two) times daily. 04/19/19   [provider]    Allergies    Sulfur, Benzonatate, Clindamycin/lincomycin, and Codeine  Review of Systems   Review of Systems Ten systems reviewed and are negative for acute change, except as noted in the HPI.   Physical Exam Updated Vital Signs BP (!) 142/84 (BP Location: Right Arm)   Pulse 62   Temp 97.8 F (36.6 C) (Oral)   Resp 18   Ht 5\' 6"  (1.676 m)   Wt 72.6 kg   LMP 09/02/2019   SpO2 100%   BMI 25.82 kg/m   Physical Exam Vitals and nursing note reviewed.  Constitutional:      General: She is not in acute distress.    Appearance: She is well-developed. She is not diaphoretic.  HENT:     Head: Normocephalic and  atraumatic.  Eyes:     General: No scleral icterus.    Conjunctiva/sclera: Conjunctivae normal.  Cardiovascular:     Rate and Rhythm: Normal rate and regular rhythm.     Heart sounds: Normal heart sounds. No murmur heard.  No friction rub. No gallop.   Pulmonary:     Effort: Pulmonary effort is normal. No respiratory distress.     Breath sounds: Normal breath sounds.  Abdominal:     General: Bowel sounds are normal. There is no distension.     Palpations: Abdomen is soft. There is no mass.     Tenderness: There is no abdominal tenderness. There is no guarding.  Musculoskeletal:     Cervical back: Normal range of motion.       Legs:     Comments: Tenderness to palpation along the medial right leg.  No cordlike or ropey structures, no heat, no abnormal swelling.  Normal range of motion with the hips, knees and ankles bilaterally.  Normal strength, patellar tendon reflexes 2+ bilaterally.  Skin:    General: Skin is warm and dry.  Neurological:     Mental Status: She is alert and oriented to person, place, and time.  Psychiatric:        Behavior: Behavior normal.     ED Results / Procedures / Treatments   Labs (all labs ordered are listed, but only abnormal results are displayed) Labs Reviewed - No data to display  EKG None  Radiology No results found.  Procedures Procedures (including critical care time)  Medications Ordered in ED Medications - No data to display  ED Course  I have reviewed the triage vital signs and the nursing notes.  Pertinent labs & imaging results that were available during my care of the patient were reviewed by me and considered in my medical decision making (see chart for details).    MDM Rules/Calculators/A&P                          Patient here with medial right leg pain.  She describes the pain as sharp, shooting and achy.  It sounds neuropathic in nature and she does have a history of lower back pain. Patient will return tomorrow  morning for DVT rule out.  She does not appear to have any obvious phlebitis.  It does however track in the distribution of the  saphenous vein.  Patient will be discharged with naproxen to add to her current pain regimen.  Patient will also be given prescription for Medrol Dosepak as this may be coming from her back however have asked her not to start it until 10 days after the Covid vaccination as I do not want this to interfere with her immune response.  Patient understands.  She appears to have no red flag symptoms or weakness in the lower extremities.  Discussed return precautions. Final Clinical Impression(s) / ED Diagnoses Final diagnoses:  None    Rx / DC Orders ED Discharge Orders    None       Margarita Mail, PA-C 09/05/19 2046    Isla Pence, MD 09/05/19 2324

## 2019-09-06 ENCOUNTER — Ambulatory Visit (HOSPITAL_COMMUNITY)
Admission: RE | Admit: 2019-09-06 | Discharge: 2019-09-06 | Disposition: A | Discharge status: Home / Self Care | Payer: BC Managed Care – PPO | Source: Ambulatory Visit | Attending: Emergency Medicine | Admitting: Emergency Medicine

## 2019-09-06 DIAGNOSIS — M79604 Pain in right leg: Secondary | ICD-10-CM | POA: Diagnosis not present

## 2019-09-06 NOTE — ED Provider Notes (Signed)
Patient return for follow-up of right lower extremity DVT study ordered by Francee Piccolo.  I personally reviewed her study results.  Imaging negative for DVT.  Previous provider had discussed follow-up with her PCP.  I further encouraged the patient to make an appointment.  She voices understanding and is agreeable.  Return precautions discussed.  Patient without complaints at this time.  Stable for discharge home.   Garald Balding, PA-C 09/06/19 1214    Fredia Sorrow, MD 09/07/19 1309

## 2019-12-18 ENCOUNTER — Other Ambulatory Visit: Payer: Self-pay

## 2019-12-18 ENCOUNTER — Encounter (HOSPITAL_COMMUNITY): Payer: Self-pay | Admitting: *Deleted

## 2019-12-18 ENCOUNTER — Emergency Department (HOSPITAL_COMMUNITY)
Admission: EM | Admit: 2019-12-18 | Discharge: 2019-12-18 | Disposition: A | Discharge status: Home / Self Care | Payer: BC Managed Care – PPO | Attending: Emergency Medicine | Admitting: Emergency Medicine

## 2019-12-18 ENCOUNTER — Emergency Department (HOSPITAL_COMMUNITY): Payer: BC Managed Care – PPO

## 2019-12-18 DIAGNOSIS — R002 Palpitations: Secondary | ICD-10-CM | POA: Insufficient documentation

## 2019-12-18 DIAGNOSIS — R0789 Other chest pain: Secondary | ICD-10-CM | POA: Diagnosis present

## 2019-12-18 DIAGNOSIS — Z5321 Procedure and treatment not carried out due to patient leaving prior to being seen by health care provider: Secondary | ICD-10-CM | POA: Insufficient documentation

## 2019-12-18 LAB — BASIC METABOLIC PANEL
Anion gap: 6 (ref 5–15)
BUN: 6 mg/dL (ref 6–20)
CO2: 28 mmol/L (ref 22–32)
Calcium: 9.2 mg/dL (ref 8.9–10.3)
Chloride: 104 mmol/L (ref 98–111)
Creatinine, Ser: 0.52 mg/dL (ref 0.44–1.00)
GFR calc Af Amer: >60 mL/min (ref >60)
GFR calc non Af Amer: >60 mL/min (ref >60)
Glucose, Bld: 83 mg/dL (ref 70–99)
Potassium: 4.1 mmol/L (ref 3.5–5.1)
Sodium: 138 mmol/L (ref 135–145)

## 2019-12-18 LAB — TROPONIN I (HIGH SENSITIVITY): Troponin I (High Sensitivity): <2 ng/L (ref <18)

## 2019-12-18 LAB — CBC
HCT: 38.6 % (ref 36.0–46.0)
Hemoglobin: 13 g/dL (ref 12.0–15.0)
MCH: 32.6 pg (ref 26.0–34.0)
MCHC: 33.7 g/dL (ref 30.0–36.0)
MCV: 96.7 fL (ref 80.0–100.0)
Platelets: 219 10*3/uL (ref 150–400)
RBC: 3.99 MIL/uL (ref 3.87–5.11)
RDW: 11.8 % (ref 11.5–15.5)
WBC: 4.8 10*3/uL (ref 4.0–10.5)
nRBC: 0 % (ref 0.0–0.2)

## 2019-12-18 NOTE — ED Triage Notes (Addendum)
Pt c/o intermittent mid chest pain, palpitations, nausea and slight SOB x 1 week. Pt reports she has been under a lot of stress over the last week and her symptoms usually occur when she gets really upset.

## 2019-12-18 NOTE — ED Notes (Signed)
ED Registration left note for triage RN that pt left after triage but without being seen by ED Provider.

## 2020-02-11 ENCOUNTER — Encounter (HOSPITAL_COMMUNITY): Payer: Self-pay | Admitting: Emergency Medicine

## 2020-02-11 ENCOUNTER — Other Ambulatory Visit: Payer: Self-pay

## 2020-02-11 ENCOUNTER — Emergency Department (HOSPITAL_COMMUNITY)
Admission: EM | Admit: 2020-02-11 | Discharge: 2020-02-11 | Disposition: A | Discharge status: Home / Self Care | Payer: BC Managed Care – PPO | Attending: Emergency Medicine | Admitting: Emergency Medicine

## 2020-02-11 DIAGNOSIS — N939 Abnormal uterine and vaginal bleeding, unspecified: Secondary | ICD-10-CM | POA: Insufficient documentation

## 2020-02-11 DIAGNOSIS — Z7982 Long term (current) use of aspirin: Secondary | ICD-10-CM | POA: Insufficient documentation

## 2020-02-11 DIAGNOSIS — J449 Chronic obstructive pulmonary disease, unspecified: Secondary | ICD-10-CM | POA: Insufficient documentation

## 2020-02-11 DIAGNOSIS — Z79899 Other long term (current) drug therapy: Secondary | ICD-10-CM | POA: Diagnosis not present

## 2020-02-11 DIAGNOSIS — F17210 Nicotine dependence, cigarettes, uncomplicated: Secondary | ICD-10-CM | POA: Insufficient documentation

## 2020-02-11 LAB — COMPREHENSIVE METABOLIC PANEL
ALT: 15 U/L (ref 0–44)
AST: 20 U/L (ref 15–41)
Albumin: 3.8 g/dL (ref 3.5–5.0)
Alkaline Phosphatase: 60 U/L (ref 38–126)
Anion gap: 6 (ref 5–15)
BUN: 8 mg/dL (ref 6–20)
CO2: 26 mmol/L (ref 22–32)
Calcium: 8.9 mg/dL (ref 8.9–10.3)
Chloride: 105 mmol/L (ref 98–111)
Creatinine, Ser: 0.65 mg/dL (ref 0.44–1.00)
GFR, Estimated: >60 mL/min (ref >60)
Glucose, Bld: 96 mg/dL (ref 70–99)
Potassium: 4.2 mmol/L (ref 3.5–5.1)
Sodium: 137 mmol/L (ref 135–145)
Total Bilirubin: 0.3 mg/dL (ref 0.3–1.2)
Total Protein: 6.8 g/dL (ref 6.5–8.1)

## 2020-02-11 LAB — CBC WITH DIFFERENTIAL/PLATELET
Abs Immature Granulocytes: 0.01 10*3/uL (ref 0.00–0.07)
Basophils Absolute: 0 10*3/uL (ref 0.0–0.1)
Basophils Relative: 1 %
Eosinophils Absolute: 0.1 10*3/uL (ref 0.0–0.5)
Eosinophils Relative: 2 %
HCT: 39 % (ref 36.0–46.0)
Hemoglobin: 13 g/dL (ref 12.0–15.0)
Immature Granulocytes: 0 %
Lymphocytes Relative: 41 %
Lymphs Abs: 1.9 10*3/uL (ref 0.7–4.0)
MCH: 32.3 pg (ref 26.0–34.0)
MCHC: 33.3 g/dL (ref 30.0–36.0)
MCV: 96.8 fL (ref 80.0–100.0)
Monocytes Absolute: 0.5 10*3/uL (ref 0.1–1.0)
Monocytes Relative: 10 %
Neutro Abs: 2.2 10*3/uL (ref 1.7–7.7)
Neutrophils Relative %: 46 %
Platelets: 196 10*3/uL (ref 150–400)
RBC: 4.03 MIL/uL (ref 3.87–5.11)
RDW: 11 % — ABNORMAL LOW (ref 11.5–15.5)
WBC: 4.7 10*3/uL (ref 4.0–10.5)
nRBC: 0 % (ref 0.0–0.2)

## 2020-02-11 LAB — SAMPLE TO BLOOD BANK

## 2020-02-11 LAB — HCG, SERUM, QUALITATIVE: Preg, Serum: NEGATIVE

## 2020-02-11 MED ORDER — MEGESTROL ACETATE 40 MG PO TABS: ORAL_TABLET | ORAL | 0 refills | Status: DC

## 2020-02-11 MED ORDER — SODIUM CHLORIDE 0.9 % IV BOLUS
1000.0000 mL | Freq: Once | INTRAVENOUS | Status: AC
  Administered 2020-02-11: 1000 mL via INTRAVENOUS

## 2020-02-11 NOTE — Discharge Instructions (Addendum)
Take megace as prescribed. Call [facility] today for follow up next week Return if your bleeding becomes worse or you are weaker

## 2020-02-11 NOTE — ED Triage Notes (Signed)
Pt states she has had a menstrual cycle x 4 weeks. States the bleeding has lightened. Pt states she feels weak

## 2020-02-11 NOTE — ED Provider Notes (Signed)
[facility] EMERGENCY DEPARTMENT Provider Note   CSN: [id number] Arrival date & time: 02/11/20  3818     History Chief Complaint  Patient presents with  . Vaginal Bleeding    Robin Baldwin is a 49 y.o. female.  HPI 49 yo female g2p0 presents  With complaints of vaginal bleeding for one month.  Patient reports regular menstrual cycles until past month with some spotting.  She reports the bleeding in the past month was light then heavy then light again- now using 4 pads/day.  She reports a history of fibroids and some soreness lower abdomen.  She goes to [facility].  She reports trying to call and unable to get a hold of them.    Past Medical History:  Diagnosis Date  . COPD (chronic obstructive pulmonary disease) [facility])     Patient Active Problem List   Diagnosis Date Noted  . Uterine fibroid 01/30/2016  . Endometrial polyp 01/30/2016  . Smoker 01/30/2016  . COPD (chronic obstructive pulmonary disease) ([place]) 01/30/2016  . Menometrorrhagia 12/29/2015    Past Surgical History:  Procedure Laterality Date  . CHOLECYSTECTOMY    . DILATION AND CURETTAGE OF UTERUS    . TUBAL LIGATION       OB History    Gravida  2   Para      Term      Preterm      AB  2   Living  0     SAB  2   TAB      Ectopic      Multiple      Live Births              Family History  Problem Relation Age of Onset  . Heart attack Father   . Heart failure Mother   . Drug abuse Brother        drug over dose  . Heart attack Brother     Social History   Tobacco Use  . Smoking status: Current Every Day Smoker    Packs/day: 0.50    Types: Cigarettes  . Smokeless tobacco: Never Used  Vaping Use  . Vaping Use: Some days  Substance Use Topics  . Alcohol use: Yes    Comment: occasionally   . Drug use: No    Home Medications Prior to Admission medications   Medication Sig Start Date End Date Taking? Authorizing Provider  Acetaminophen (TYLENOL ARTHRITIS EXT RELIEF PO)  Take 1 tablet by mouth daily as needed (for pain).    [provider]  albuterol (PROVENTIL) (2.5 MG/3ML) 0.083% nebulizer solution Take 2.5 mg by nebulization every 6 (six) hours as needed for wheezing or shortness of breath.    [provider]  albuterol (VENTOLIN HFA) 108 (90 Base) MCG/ACT inhaler Inhale 1-2 puffs into the lungs every 6 (six) hours as needed for wheezing or shortness of breath.  11/23/18   [provider]  aspirin EC 81 MG tablet Take 81 mg by mouth daily.    [provider]  cyanocobalamin 100 MCG tablet Take 100 mcg by mouth daily.    [provider]  Ferrous Gluconate (IRON 27 PO) Take 1 tablet by mouth See admin instructions. Takes daily during menstrual    [provider]  Fluticasone-Umeclidin-Vilant (TRELEGY ELLIPTA) 100-62.5-25 MCG/INH AEPB Inhale 1 puff into the lungs daily.    [provider]  methylPREDNISolone (MEDROL DOSEPAK) 4 MG TBPK tablet Use as directed 09/05/19   Margarita Mail, PA-C  Multiple Vitamin (MULTIVITAMIN) tablet Take 1 tablet by mouth daily.    [provider]  naproxen (NAPROSYN) 500 MG tablet Take 1 tablet (500 mg total) by mouth 2 (two) times daily. 09/05/19   Margarita Mail, PA-C  oxyCODONE-acetaminophen (PERCOCET/ROXICET) 5-325 MG tablet Take 1 tablet by mouth 2 (two) times a day.    [provider]  pantoprazole (PROTONIX) 40 MG tablet Take 40 mg by mouth 2 (two) times daily. 04/19/19   [provider]    Allergies    Sulfur, Benzonatate, Clindamycin/lincomycin, and Codeine  Review of Systems   Review of Systems  Gastrointestinal: Positive for constipation.  All other systems reviewed and are negative.   Physical Exam Updated Vital Signs BP 135/85   Pulse (!) 50   Temp 97.8 F (36.6 C) (Oral)   Resp 17   Ht 1.676 m (5\' 6" )   Wt 70.3 kg   SpO2 100%   BMI 25.02 kg/m   Physical Exam Vitals and nursing note reviewed.  Constitutional:       Appearance: Normal appearance.  HENT:     Right Ear: External ear normal.     Left Ear: External ear normal.     Nose: Nose normal.     Mouth/Throat:     Mouth: Mucous membranes are moist.  Eyes:     Extraocular Movements: Extraocular movements intact.     Pupils: Pupils are equal, round, and reactive to light.  Cardiovascular:     Rate and Rhythm: Normal rate and regular rhythm.  Pulmonary:     Effort: Pulmonary effort is normal.  Abdominal:     General: Abdomen is flat.     Palpations: Abdomen is soft.  Genitourinary:    General: Normal vulva.  Musculoskeletal:        General: Normal range of motion.     Cervical back: Normal range of motion.  Neurological:     Mental Status: She is alert.     ED Results / Procedures / Treatments   Labs (all labs ordered are listed, but only abnormal results are displayed) Labs Reviewed  CBC WITH DIFFERENTIAL/PLATELET - Abnormal; Notable for the following components:      Result Value   RDW 11.0 (*)    All other components within normal limits  COMPREHENSIVE METABOLIC PANEL  HCG, SERUM, QUALITATIVE  SAMPLE TO BLOOD BANK    EKG None  Radiology No results found.  Procedures Procedures (including critical care time)  Medications Ordered in ED Medications  sodium chloride 0.9 % bolus 1,000 mL (1,000 mLs Intravenous New Bag/Given 02/11/20 0603)    ED Course  I have reviewed the triage vital signs and the nursing notes.  Pertinent labs & imaging results that were available during my care of the patient were reviewed by me and considered in my medical decision making (see chart for details).    MDM Rules/Calculators/A&P                         49 year old female with abnormal uterine bleeding.  Labs and vital signs are stable.  I have discussed the patient's care with Dr. Elonda Husky.  Plan Megace 120 mg for 5 days and then 80 mg daily until seen in follow-up by gynecology.  Return precautions such as severe bleeding or weakness  discussed.  The office is aware she will be calling and she should be seen next week. Final Clinical Impression(s) / ED Diagnoses Final diagnoses:  None  Rx / DC Orders ED Discharge Orders    None       Pattricia Boss, MD 02/11/20 [phone]

## 2020-03-09 ENCOUNTER — Emergency Department (HOSPITAL_COMMUNITY)
Admission: EM | Admit: 2020-03-09 | Discharge: 2020-03-09 | Disposition: A | Discharge status: Home / Self Care | Payer: BC Managed Care – PPO

## 2020-03-24 ENCOUNTER — Other Ambulatory Visit (HOSPITAL_COMMUNITY)
Admission: RE | Admit: 2020-03-24 | Discharge: 2020-03-24 | Disposition: A | Discharge status: Home / Self Care | Payer: BC Managed Care – PPO | Source: Ambulatory Visit | Attending: Adult Health | Admitting: Adult Health

## 2020-03-24 ENCOUNTER — Other Ambulatory Visit: Payer: Self-pay

## 2020-03-24 ENCOUNTER — Ambulatory Visit (INDEPENDENT_AMBULATORY_CARE_PROVIDER_SITE_OTHER): Payer: BC Managed Care – PPO | Admitting: Adult Health

## 2020-03-24 ENCOUNTER — Encounter: Payer: Self-pay | Admitting: Adult Health

## 2020-03-24 VITALS — BP 131/83 | HR 90 | Ht 66.0 in | Wt 151.0 lb

## 2020-03-24 DIAGNOSIS — Z01419 Encounter for gynecological examination (general) (routine) without abnormal findings: Secondary | ICD-10-CM | POA: Diagnosis present

## 2020-03-24 DIAGNOSIS — N921 Excessive and frequent menstruation with irregular cycle: Secondary | ICD-10-CM | POA: Diagnosis not present

## 2020-03-24 DIAGNOSIS — Z1211 Encounter for screening for malignant neoplasm of colon: Secondary | ICD-10-CM

## 2020-03-24 DIAGNOSIS — F419 Anxiety disorder, unspecified: Secondary | ICD-10-CM | POA: Diagnosis not present

## 2020-03-24 LAB — HEMOCCULT GUIAC POC 1CARD (OFFICE): Fecal Occult Blood, POC: NEGATIVE

## 2020-03-24 MED ORDER — NORETHINDRONE ACETATE 5 MG PO TABS: 5.0000 mg | ORAL_TABLET | Freq: Every day | ORAL | 2 refills | Status: DC

## 2020-03-24 NOTE — Progress Notes (Signed)
Patient ID: Robin Baldwin, female   DOB: [birth year], 50 y.o.   MRN: [id number] History of Present Illness: Gilberto is a 50 year old white female, divorced, G2P0020, in for a well woman gyn exam and pap and is complaining of heavy periods, and bleed 3 weeks in November. She works at [employer] and is moving to [place], with her 2 dogs.  PCP is Gilman Schmidt, NP in [place].   Current Medications, Allergies, Past Medical History, Past Surgical History, Family History and Social History were reviewed in [employer] record.     Review of Systems:  Patient denies any headaches, hearing loss, fatigue, blurred vision, shortness of breath, chest pain, abdominal pain, problems with urination, or intercourse(not active). No mood swings. She has chronic pain in back, hips,knees and feet  She has constipation +heavy periods and lasted 3 weeks in November, was seen in ER and given Megace and it stopped but bleeding again Has occasional night sweat Has nausea at times   Physical Exam:BP 131/83 (BP Location: Left Arm, Patient Position: Sitting, Cuff Size: Normal)   Pulse 90   Ht 5\' 6"  (1.676 m)   Wt 151 lb (68.5 kg)   BMI 24.37 kg/m  General:  Well developed, well nourished, no acute distress Skin:  Warm and dry Neck:  Midline trachea, normal thyroid, good ROM, no lymphadenopathy Lungs; Clear to auscultation bilaterally Breast:  No dominant palpable mass, retraction, or nipple discharge Cardiovascular: Regular rate and rhythm Abdomen:  Soft, non tender, no hepatosplenomegaly Pelvic:  External genitalia is normal in appearance, no lesions.  The vagina is normal in appearance,+period blood in vault. Urethra has no lesions or masses. The cervix is smooth with stenotic os, pap with GC/CHL and  HRHPV genotyping performed.  Uterus is felt to be normal size, shape, and contour.  No adnexal masses or tenderness noted.Bladder is non tender, no masses felt. Rectal: Good sphincter tone,  no polyps, or hemorrhoids felt.  Hemoccult negative. Extremities/musculoskeletal:  No swelling or varicosities noted, no clubbing or cyanosis Psych:  No mood changes, alert and cooperative,seems happy AA is 1 Fall risk is low PHQ 9 score is 16, no SI,declines meds  GAD 7 score is 15, was anxious and worried  over the bleeding and possibility of surgery,   [facility] - 03/24/20 1121      Pregnancy Intention Screening   Does the patient want to become pregnant in the next year? No    Does the patient's partner want to become pregnant in the next year? No    Would the patient like to discuss contraceptive options today? No      Contraception Wrap Up   Current Method Female Sterilization    End Method Female Sterilization    Contraception Counseling Provided No         Examination chaperoned by 05/22/20 LPN   Impression and Plan: 1. Encounter for gynecological examination with Papanicolaou smear of cervix Pap sent Physical in 1 year Pap in 3 if normal Mammogram yearly Cologuard every 3 years   2. Encounter for screening fecal occult blood testing   3. Menorrhagia with irregular cycle -will Rx aygestin Meds ordered this encounter  Medications  . norethindrone (AYGESTIN) 5 MG tablet    Sig: Take 1 tablet (5 mg total) by mouth daily.    Dispense:  30 tablet    Refill:  2    Order Specific Question:   Supervising Provider    Answer:   Faith Rogue  H [[id number]]  -will get pelvic US 1/7 a Forestine Na at 3:30 pm  Follow up with me in 4 weeks or sooner if needed   4. Anxiety She declines meds Will talk again in 4 weeks

## 2020-03-25 ENCOUNTER — Other Ambulatory Visit: Payer: BC Managed Care – PPO | Admitting: Adult Health

## 2020-03-28 ENCOUNTER — Ambulatory Visit (HOSPITAL_COMMUNITY)
Admission: RE | Admit: 2020-03-28 | Discharge: 2020-03-28 | Disposition: A | Discharge status: Home / Self Care | Payer: BC Managed Care – PPO | Source: Ambulatory Visit | Attending: Adult Health | Admitting: Adult Health

## 2020-03-28 ENCOUNTER — Other Ambulatory Visit: Payer: Self-pay

## 2020-03-28 DIAGNOSIS — N921 Excessive and frequent menstruation with irregular cycle: Secondary | ICD-10-CM | POA: Diagnosis present

## 2020-03-31 LAB — CYTOLOGY - PAP
Chlamydia: NEGATIVE
Comment: NEGATIVE
Comment: NEGATIVE
Comment: NORMAL
Diagnosis: NEGATIVE
High risk HPV: NEGATIVE
Neisseria Gonorrhea: NEGATIVE

## 2020-04-02 ENCOUNTER — Telehealth: Payer: Self-pay | Admitting: Adult Health

## 2020-04-02 NOTE — Telephone Encounter (Signed)
Left message that endometrium is thickened and ?polyp, needs endometrial biopsy call me and let me know you heard this and made appt.

## 2020-04-17 ENCOUNTER — Ambulatory Visit: Payer: BC Managed Care – PPO | Admitting: Obstetrics & Gynecology

## 2020-04-17 ENCOUNTER — Other Ambulatory Visit: Payer: Self-pay

## 2020-04-17 ENCOUNTER — Encounter: Payer: Self-pay | Admitting: Obstetrics & Gynecology

## 2020-04-17 VITALS — BP 146/87 | HR 73 | Ht 66.0 in | Wt 146.5 lb

## 2020-04-17 DIAGNOSIS — N921 Excessive and frequent menstruation with irregular cycle: Secondary | ICD-10-CM

## 2020-04-17 DIAGNOSIS — N946 Dysmenorrhea, unspecified: Secondary | ICD-10-CM | POA: Diagnosis not present

## 2020-04-17 MED ORDER — MEGESTROL ACETATE 40 MG PO TABS: ORAL_TABLET | ORAL | 3 refills | Status: DC

## 2020-04-17 NOTE — Progress Notes (Signed)
Patient ID: Robin Baldwin, female   DOB: 1970/04/13, 50 y.o.   MRN: [id number] Preoperative History and Physical  Robin Baldwin is a 50 y.o. G2P0020 with No LMP recorded. (Menstrual status: Perimenopausal). admitted for a increasing problematic menstrual periods, starts at any time, could bleed 3 weeks at a time, heavy, clots, cramps.  Sonogram reveals an endometrial polyp small fibroids Discussed options and desire to remove polyp Will schedule hysteroscopy uterine curettage removal of polyp Minerva endometrial ablation  PMH:    Past Medical History:  Diagnosis Date  . COPD (chronic obstructive pulmonary disease) ([place])   . Hypertension   . Mental disorder     PSH:     Past Surgical History:  Procedure Laterality Date  . CHOLECYSTECTOMY    . DILATION AND CURETTAGE OF UTERUS    . TUBAL LIGATION      POb/GynH:      OB History    Gravida  2   Para      Term      Preterm      AB  2   Living  0     SAB  2   IAB      Ectopic      Multiple      Live Births              SH:   Social History   Tobacco Use  . Smoking status: Current Every Day Smoker    Packs/day: 0.50    Types: Cigarettes  . Smokeless tobacco: Never Used  Vaping Use  . Vaping Use: Some days  Substance Use Topics  . Alcohol use: Yes    Comment: occasionally   . Drug use: No    FH:    Family History  Problem Relation Age of Onset  . Heart attack Father   . Heart failure Mother   . Drug abuse Brother        drug over dose  . Heart attack Brother      Allergies:  Allergies  Allergen Reactions  . Elemental Sulfur Hives and Other (See Comments)    Flushing of skin, burning sensation, blisters  . Benzonatate Other (See Comments)    Chest tightness  . Clindamycin/Lincomycin Hives  . Codeine Itching    Medications:       Current Outpatient Medications:  .  Acetaminophen (TYLENOL ARTHRITIS EXT RELIEF PO), Take 1 tablet by mouth daily as needed (for pain)., Disp: , Rfl:  .   albuterol (PROVENTIL) (2.5 MG/3ML) 0.083% nebulizer solution, Take 2.5 mg by nebulization every 6 (six) hours as needed for wheezing or shortness of breath., Disp: , Rfl:  .  albuterol (VENTOLIN HFA) 108 (90 Base) MCG/ACT inhaler, Inhale 1-2 puffs into the lungs every 6 (six) hours as needed for wheezing or shortness of breath. , Disp: , Rfl:  .  ALPRAZolam (XANAX) 0.25 MG tablet, Take 0.25 mg by mouth 2 (two) times daily., Disp: , Rfl:  .  aspirin EC 81 MG tablet, Take 81 mg by mouth daily., Disp: , Rfl:  .  cyanocobalamin 100 MCG tablet, Take 100 mcg by mouth daily., Disp: , Rfl:  .  escitalopram (LEXAPRO) 10 MG tablet, Take 10 mg by mouth daily., Disp: , Rfl:  .  Ferrous Gluconate (IRON 27 PO), Take 1 tablet by mouth See admin instructions. Takes daily during menstrual, Disp: , Rfl:  .  Fluticasone-Umeclidin-Vilant (TRELEGY ELLIPTA) 100-62.5-25 MCG/INH AEPB, Inhale 1 puff into the lungs daily., Disp: , Rfl:  .  megestrol (MEGACE) 40 MG tablet, 3 tablets a day for 5 days, 2 tablets a day for 5 days then 1 tablet daily, Disp: 45 tablet, Rfl: 3 .  methylPREDNISolone (MEDROL DOSEPAK) 4 MG TBPK tablet, Use as directed, Disp: 21 tablet, Rfl: 0 .  Multiple Vitamin (MULTIVITAMIN) tablet, Take 1 tablet by mouth daily., Disp: , Rfl:  .  naproxen (NAPROSYN) 500 MG tablet, Take 1 tablet (500 mg total) by mouth 2 (two) times daily., Disp: 30 tablet, Rfl: 0 .  norethindrone (AYGESTIN) 5 MG tablet, Take 1 tablet (5 mg total) by mouth daily., Disp: 30 tablet, Rfl: 2 .  ondansetron (ZOFRAN-ODT) 4 MG disintegrating tablet, Take 4 mg by mouth every 8 (eight) hours as needed., Disp: , Rfl:  .  oxyCODONE-acetaminophen (PERCOCET/ROXICET) 5-325 MG tablet, Take 1 tablet by mouth 2 (two) times a day., Disp: , Rfl:  .  pantoprazole (PROTONIX) 40 MG tablet, Take 40 mg by mouth 2 (two) times daily., Disp: , Rfl:   Review of Systems:   Review of Systems  Constitutional: Negative for fever, chills, weight loss,  malaise/fatigue and diaphoresis.  HENT: Negative for hearing loss, ear pain, nosebleeds, congestion, sore throat, neck pain, tinnitus and ear discharge.   Eyes: Negative for blurred vision, double vision, photophobia, pain, discharge and redness.  Respiratory: Negative for cough, hemoptysis, sputum production, shortness of breath, wheezing and stridor.   Cardiovascular: Negative for chest pain, palpitations, orthopnea, claudication, leg swelling and PND.  Gastrointestinal: Positive for abdominal pain. Negative for heartburn, nausea, vomiting, diarrhea, constipation, blood in stool and melena.  Genitourinary: Negative for dysuria, urgency, frequency, hematuria and flank pain.  Musculoskeletal: Negative for myalgias, back pain, joint pain and falls.  Skin: Negative for itching and rash.  Neurological: Negative for dizziness, tingling, tremors, sensory change, speech change, focal weakness, seizures, loss of consciousness, weakness and headaches.  Endo/Heme/Allergies: Negative for environmental allergies and polydipsia. Does not bruise/bleed easily.  Psychiatric/Behavioral: Negative for depression, suicidal ideas, hallucinations, memory loss and substance abuse. The patient is not nervous/anxious and does not have insomnia.      PHYSICAL EXAM:  Blood pressure (!) 146/87, pulse 73, height 5\' 6"  (1.676 m), weight 146 lb 8 oz (66.5 kg).    Vitals reviewed. Constitutional: She is oriented to person, place, and time. She appears well-developed and well-nourished.  HENT:  Head: Normocephalic and atraumatic.  Right Ear: External ear normal.  Left Ear: External ear normal.  Nose: Nose normal.  Mouth/Throat: Oropharynx is clear and moist.  Eyes: Conjunctivae and EOM are normal. Pupils are equal, round, and reactive to light. Right eye exhibits no discharge. Left eye exhibits no discharge. No scleral icterus.  Neck: Normal range of motion. Neck supple. No tracheal deviation present. No thyromegaly  present.  Cardiovascular: Normal rate, regular rhythm, normal heart sounds and intact distal pulses.  Exam reveals no gallop and no friction rub.   No murmur heard. Respiratory: Effort normal and breath sounds normal. No respiratory distress. She has no wheezes. She has no rales. She exhibits no tenderness.  GI: Soft. Bowel sounds are normal. She exhibits no distension and no mass. There is tenderness. There is no rebound and no guarding.  Genitourinary:       Vulva is normal without lesions Vagina is pink moist without discharge Cervix normal in appearance and pap is normal Uterus is normal size, contour, position, consistency, mobility, non-tender Adnexa is negative with normal sized ovaries by sonogram  Musculoskeletal: Normal range of motion. She exhibits no edema  and no tenderness.  Neurological: She is alert and oriented to person, place, and time. She has normal reflexes. She displays normal reflexes. No cranial nerve deficit. She exhibits normal muscle tone. Coordination normal.  Skin: Skin is warm and dry. No rash noted. No erythema. No pallor.  Psychiatric: She has a normal mood and affect. Her behavior is normal. Judgment and thought content normal.    Labs: No results found for this or any previous visit (from the past 336 hour(s)).  EKG: Orders placed or performed during the hospital encounter of 09/27/18  . EKG 12-Lead  . EKG 12-Lead  . EKG    Imaging Studies: US PELVIC COMPLETE WITH TRANSVAGINAL  Result Date: 03/28/2020 CLINICAL DATA:  Menorrhagia, bleeding for over a month, pain, history of fibroids EXAM: TRANSABDOMINAL AND TRANSVAGINAL ULTRASOUND OF PELVIS TECHNIQUE: Both transabdominal and transvaginal ultrasound examinations of the pelvis were performed. Transabdominal technique was performed for global imaging of the pelvis including uterus, ovaries, adnexal regions, and pelvic cul-de-sac. It was necessary to proceed with endovaginal exam following the transabdominal  exam to visualize the uterus, endometrium, and ovaries. COMPARISON:  01/21/2016 FINDINGS: Uterus Measurements: 8.8 x 3.5 x 5.9 cm = volume: 95 mL. Anteverted. Heterogeneous myometrium. Small exophytic leiomyoma from posterior uterus 19 x 11 x 15 mm. Additional anterior wall leiomyoma at fundus 2.8 x 2.6 x 2.5 cm, exophytic. No additional uterine masses. Endometrium Thickness: 5 mm. Question polyp at upper uterine segment 10 x 5 x 13 mm. No definite endometrial fluid. Right ovary Not visualized, likely obscured by bowel Left ovary Not visualized, likely obscured by bowel Other findings No free pelvic fluid.  No adnexal masses. IMPRESSION: Question endometrial polyp at upper uterine segment 10 x 5 x 13 mm; Consider further evaluation with sonohysterogram for confirmation prior to hysteroscopy. Endometrial sampling should also be considered if patient is at high risk for endometrial carcinoma. (Ref: Radiological Reasoning: Algorithmic Workup of Abnormal Vaginal Bleeding with Endovaginal Sonography and Sonohysterography. AJR 2008; 166:A63-01) Two small exophytic uterine leiomyomata. Electronically Signed   By: Lavonia Dana M.D.   On: 03/28/2020 17:17      Assessment: Menometrorrhagia Dysmenorrhea Endometrial polyp  Plan: Hysteroscopy uterine curettage Minerva endometrial ablation removal of polyp  Florian Buff 04/17/2020 3:08 PM

## 2020-04-22 ENCOUNTER — Ambulatory Visit: Payer: BC Managed Care – PPO | Admitting: Adult Health

## 2020-05-09 ENCOUNTER — Other Ambulatory Visit: Payer: Self-pay | Admitting: Obstetrics & Gynecology

## 2020-05-09 ENCOUNTER — Encounter (HOSPITAL_COMMUNITY): Payer: Self-pay

## 2020-05-09 NOTE — Patient Instructions (Signed)
Robin Baldwin  05/09/2020     @PREFPERIOPPHARMACY @   Your procedure is scheduled on  05/14/2020.   Report to [facility] at  0700  A.M.   Call this number if you have problems the morning of surgery:  [phone]   Remember:  Do not eat or drink after midnight.                         Take these medicines the morning of surgery with A SIP OF WATER  Xanax (if needed), zofran (if needed), oxycodone (if needed), protonix.   Use your nebulizer and your inhaler before you come and bring your rescue inhaler with you.    Please brush your teeth.  Do not wear jewelry, make-up or nail polish.  Do not wear lotions, powders, or perfumes, or deodorant.  Do not shave 48 hours prior to surgery.  Men may shave face and neck.  Do not bring valuables to the hospital.  [facility] is not responsible for any belongings or valuables.  Contacts, dentures or bridgework may not be worn into surgery.  Leave your suitcase in the car.  After surgery it may be brought to your room.  For patients admitted to the hospital, discharge time will be determined by your treatment team.  Patients discharged the day of surgery will not be allowed to drive home and must have someone with them for 24 hours.  Shower with CHG the night before and the morning of your procedure. DO NOT use CHG on your face, hair or genitals.  After your night shower, dry off with a clean towel, put on clean clothes to sleep in and place clean sheets on your bed before you sleep.         DO NOT sleep with pets this night.  After your morning shower, dry off with a clean towel and put on clean, comfortable clothes and brush your teeth before you come to the hospital.     Special instructions:   DO NOT smoke tobacco or vape the morning of your procedure.  Please read over the following fact sheets that you were given. Coughing and Deep Breathing, Surgical Site Infection Prevention, Anesthesia Post-op Instructions and  Care and Recovery After Surgery       Dilation and Curettage or Vacuum Curettage, Care After This sheet gives you information about how to care for yourself after your procedure. Your doctor may also give you more specific instructions. If you have problems or questions, contact your doctor. What can I expect after the procedure? After the procedure, it is common to have:  Mild pain or cramping.  Some bleeding or spotting from the vagina. These may last for up to 2 weeks. Follow these instructions at home: Medicines  Take over-the-counter and prescription medicines only as told by your doctor. This is very important if you take blood-thinning medicine.  Ask your doctor if the medicine prescribed to you requires you to avoid driving or using machinery. Activity  If you were given a medicine to help you relax (sedative) during your procedure, it can affect you for many hours. Do not drive or use machinery until your doctor says that it is safe.  Rest as told by your doctor.  Do not sit for a long time without moving. Get up to take short walks every 1-2 hours. This is important. Ask for help if you feel weak or unsteady.  Do  not lift anything that is heavier than 10 lb (4.5 kg), or the limit that you are told, until your doctor says that it is safe.  Return to your normal activities as told by your doctor. Ask your doctor what activities are safe for you.   Lifestyle For at least 2 weeks, or as long as told by your doctor:  Do not douche.  Do not use tampons.  Do not have sex. General instructions  Wear compression stockings as told by your doctor.  It is up to you to get the results of your procedure. Ask your doctor, or the department that is doing the procedure, when your results will be ready.  Keep all follow-up visits as told by your doctor. This is important. Contact a doctor if:  You have very bad cramps that get worse or do not get better with medicine.  You  have very bad pain in your belly (abdomen).  You cannot drink fluids without vomiting.  You have pain in a different part of your pelvis. The pelvis is the area just above your thighs.  You have fluid from your vagina that smells bad.  You have a rash. Get help right away if:  You are bleeding a lot from your vagina. A lot of bleeding means soaking more than one sanitary pad in 1 hour for 2 hours in a row.  You have a fever that is above 100.22F (38.0C).  Your belly feels very tender or hard.  You have chest pain.  You have trouble breathing.  You feel dizzy.  You feel light-headed.  You pass out (faint).  You have pain in your neck or shoulder area. These symptoms may be an emergency. Do not wait to see if the symptoms will go away. Get medical help right away. Call your local emergency services (911 in the [place] Do not drive yourself to the hospital. Summary  After your procedure, it is common to have pain or cramping. It is also common to have bleeding or spotting from your vagina.  Rest as told. Do not sit for a long time without moving. Get up to take short walks every 1-2 hours.  Do not lift anything that is heavier than 10 lb (4.5 kg), or the limit that you are told.  Contact your doctor if you have fluid from your vagina that smells bad.  Get help right away if you develop any problems from the procedure. Ask your doctor what problems to watch for. This information is not intended to replace advice given to you by your health care provider. Make sure you discuss any questions you have with your health care provider. Document Revised: 04/10/2019 Document Reviewed: 04/10/2019 Elsevier Patient Education  2021 [place] Anesthesia, Adult, Care After This sheet gives you information about how to care for yourself after your procedure. Your health care provider may also give you more specific instructions. If you have problems or questions, contact your  health care provider. What can I expect after the procedure? After the procedure, the following side effects are common:  Pain or discomfort at the IV site.  Nausea.  Vomiting.  Sore throat.  Trouble concentrating.  Feeling cold or chills.  Feeling weak or tired.  Sleepiness and fatigue.  Soreness and body aches. These side effects can affect parts of the body that were not involved in surgery. Follow these instructions at home: For the time period you were told by your health care provider:  Rest.  Do not participate in activities where you could fall or become injured.  Do not drive or use machinery.  Do not drink alcohol.  Do not take sleeping pills or medicines that cause drowsiness.  Do not make important decisions or sign legal documents.  Do not take care of children on your own.   Eating and drinking  Follow any instructions from your health care provider about eating or drinking restrictions.  When you feel hungry, start by eating small amounts of foods that are soft and easy to digest (bland), such as toast. Gradually return to your regular diet.  Drink enough fluid to keep your urine pale yellow.  If you vomit, rehydrate by drinking water, juice, or clear broth. General instructions  If you have sleep apnea, surgery and certain medicines can increase your risk for breathing problems. Follow instructions from your health care provider about wearing your sleep device: ? Anytime you are sleeping, including during daytime naps. ? While taking prescription pain medicines, sleeping medicines, or medicines that make you drowsy.  Have a responsible adult stay with you for the time you are told. It is important to have someone help care for you until you are awake and alert.  Return to your normal activities as told by your health care provider. Ask your health care provider what activities are safe for you.  Take over-the-counter and prescription medicines  only as told by your health care provider.  If you smoke, do not smoke without supervision.  Keep all follow-up visits as told by your health care provider. This is important. Contact a health care provider if:  You have nausea or vomiting that does not get better with medicine.  You cannot eat or drink without vomiting.  You have pain that does not get better with medicine.  You are unable to pass urine.  You develop a skin rash.  You have a fever.  You have redness around your IV site that gets worse. Get help right away if:  You have difficulty breathing.  You have chest pain.  You have blood in your urine or stool, or you vomit blood. Summary  After the procedure, it is common to have a sore throat or nausea. It is also common to feel tired.  Have a responsible adult stay with you for the time you are told. It is important to have someone help care for you until you are awake and alert.  When you feel hungry, start by eating small amounts of foods that are soft and easy to digest (bland), such as toast. Gradually return to your regular diet.  Drink enough fluid to keep your urine pale yellow.  Return to your normal activities as told by your health care provider. Ask your health care provider what activities are safe for you. This information is not intended to replace advice given to you by your health care provider. Make sure you discuss any questions you have with your health care provider. Document Revised: 11/22/2019 Document Reviewed: 06/21/2019 Elsevier Patient Education  2021 [employer].

## 2020-05-12 ENCOUNTER — Encounter (HOSPITAL_COMMUNITY)
Admission: RE | Admit: 2020-05-12 | Discharge: 2020-05-12 | Disposition: A | Discharge status: Home / Self Care | Payer: BC Managed Care – PPO | Source: Ambulatory Visit | Attending: Obstetrics & Gynecology | Admitting: Obstetrics & Gynecology

## 2020-05-12 ENCOUNTER — Other Ambulatory Visit (HOSPITAL_COMMUNITY)
Admission: RE | Admit: 2020-05-12 | Discharge: 2020-05-12 | Disposition: A | Discharge status: Home / Self Care | Payer: BC Managed Care – PPO | Source: Ambulatory Visit | Attending: Obstetrics & Gynecology | Admitting: Obstetrics & Gynecology

## 2020-05-12 ENCOUNTER — Other Ambulatory Visit: Payer: Self-pay

## 2020-05-12 DIAGNOSIS — Z791 Long term (current) use of non-steroidal anti-inflammatories (NSAID): Secondary | ICD-10-CM | POA: Diagnosis not present

## 2020-05-12 DIAGNOSIS — Z01812 Encounter for preprocedural laboratory examination: Secondary | ICD-10-CM | POA: Insufficient documentation

## 2020-05-12 DIAGNOSIS — Z8249 Family history of ischemic heart disease and other diseases of the circulatory system: Secondary | ICD-10-CM | POA: Diagnosis not present

## 2020-05-12 DIAGNOSIS — Z793 Long term (current) use of hormonal contraceptives: Secondary | ICD-10-CM | POA: Diagnosis not present

## 2020-05-12 DIAGNOSIS — Z885 Allergy status to narcotic agent status: Secondary | ICD-10-CM | POA: Diagnosis not present

## 2020-05-12 DIAGNOSIS — Z79899 Other long term (current) drug therapy: Secondary | ICD-10-CM | POA: Diagnosis not present

## 2020-05-12 DIAGNOSIS — N841 Polyp of cervix uteri: Secondary | ICD-10-CM | POA: Diagnosis not present

## 2020-05-12 DIAGNOSIS — J449 Chronic obstructive pulmonary disease, unspecified: Secondary | ICD-10-CM | POA: Diagnosis not present

## 2020-05-12 DIAGNOSIS — Z20822 Contact with and (suspected) exposure to covid-19: Secondary | ICD-10-CM | POA: Insufficient documentation

## 2020-05-12 DIAGNOSIS — Z881 Allergy status to other antibiotic agents status: Secondary | ICD-10-CM | POA: Diagnosis not present

## 2020-05-12 DIAGNOSIS — Z7952 Long term (current) use of systemic steroids: Secondary | ICD-10-CM | POA: Diagnosis not present

## 2020-05-12 DIAGNOSIS — Z7982 Long term (current) use of aspirin: Secondary | ICD-10-CM | POA: Diagnosis not present

## 2020-05-12 DIAGNOSIS — N920 Excessive and frequent menstruation with regular cycle: Secondary | ICD-10-CM | POA: Diagnosis present

## 2020-05-12 DIAGNOSIS — F17210 Nicotine dependence, cigarettes, uncomplicated: Secondary | ICD-10-CM | POA: Diagnosis not present

## 2020-05-12 DIAGNOSIS — Z888 Allergy status to other drugs, medicaments and biological substances status: Secondary | ICD-10-CM | POA: Diagnosis not present

## 2020-05-12 HISTORY — DX: Other complications of anesthesia, initial encounter: T88.59XA

## 2020-05-12 LAB — CBC
HCT: 41.3 % (ref 36.0–46.0)
Hemoglobin: 14.1 g/dL (ref 12.0–15.0)
MCH: 32.9 pg (ref 26.0–34.0)
MCHC: 34.1 g/dL (ref 30.0–36.0)
MCV: 96.5 fL (ref 80.0–100.0)
Platelets: 230 10*3/uL (ref 150–400)
RBC: 4.28 MIL/uL (ref 3.87–5.11)
RDW: 11.6 % (ref 11.5–15.5)
WBC: 5.6 10*3/uL (ref 4.0–10.5)
nRBC: 0 % (ref 0.0–0.2)

## 2020-05-12 LAB — COMPREHENSIVE METABOLIC PANEL
ALT: 17 U/L (ref 0–44)
AST: 22 U/L (ref 15–41)
Albumin: 4.2 g/dL (ref 3.5–5.0)
Alkaline Phosphatase: 45 U/L (ref 38–126)
Anion gap: 7 (ref 5–15)
BUN: 11 mg/dL (ref 6–20)
CO2: 23 mmol/L (ref 22–32)
Calcium: 9 mg/dL (ref 8.9–10.3)
Chloride: 105 mmol/L (ref 98–111)
Creatinine, Ser: 0.66 mg/dL (ref 0.44–1.00)
GFR, Estimated: >60 mL/min (ref >60)
Glucose, Bld: 75 mg/dL (ref 70–99)
Potassium: 4.2 mmol/L (ref 3.5–5.1)
Sodium: 135 mmol/L (ref 135–145)
Total Bilirubin: 0.3 mg/dL (ref 0.3–1.2)
Total Protein: 7.2 g/dL (ref 6.5–8.1)

## 2020-05-12 LAB — URINALYSIS, ROUTINE W REFLEX MICROSCOPIC
Bilirubin Urine: NEGATIVE
Glucose, UA: NEGATIVE mg/dL
Hgb urine dipstick: NEGATIVE
Ketones, ur: NEGATIVE mg/dL
Leukocytes,Ua: NEGATIVE
Nitrite: NEGATIVE
Protein, ur: NEGATIVE mg/dL
Specific Gravity, Urine: 1.01 (ref 1.005–1.030)
pH: 5 (ref 5.0–8.0)

## 2020-05-12 LAB — RAPID HIV SCREEN (HIV 1/2 AB+AG)
HIV 1/2 Antibodies: NONREACTIVE
HIV-1 P24 Antigen - HIV24: NONREACTIVE

## 2020-05-12 LAB — TYPE AND SCREEN
ABO/RH(D): O POS
Antibody Screen: NEGATIVE

## 2020-05-12 LAB — HCG, QUANTITATIVE, PREGNANCY: hCG, Beta Chain, Quant, S: <1 m[IU]/mL (ref <5)

## 2020-05-12 LAB — SARS CORONAVIRUS 2 (TAT 6-24 HRS): SARS Coronavirus 2: NEGATIVE

## 2020-05-12 NOTE — Progress Notes (Signed)
Dr Briant Cedar notified of complication of not being able to void after both Cholecystectomy and tubal ligation surgeries.  She came back to ED after Cholecystectomy for in/out cath and was admitted after tubal ligation.  Patient has copies of Tubal ligation anesthesia record and discharge summary from [facility]

## 2020-05-14 ENCOUNTER — Encounter (HOSPITAL_COMMUNITY)
Admission: RE | Disposition: A | Discharge status: Home / Self Care | Payer: Self-pay | Source: Home / Self Care | Attending: Obstetrics & Gynecology

## 2020-05-14 ENCOUNTER — Ambulatory Visit (HOSPITAL_COMMUNITY): Payer: BC Managed Care – PPO | Admitting: Certified Registered Nurse Anesthetist

## 2020-05-14 ENCOUNTER — Encounter: Payer: Self-pay | Admitting: *Deleted

## 2020-05-14 ENCOUNTER — Ambulatory Visit (HOSPITAL_COMMUNITY)
Admission: RE | Admit: 2020-05-14 | Discharge: 2020-05-14 | Disposition: A | Discharge status: Home / Self Care | Payer: BC Managed Care – PPO | Attending: Obstetrics & Gynecology | Admitting: Obstetrics & Gynecology

## 2020-05-14 ENCOUNTER — Encounter (HOSPITAL_COMMUNITY): Payer: Self-pay | Admitting: Obstetrics & Gynecology

## 2020-05-14 DIAGNOSIS — Z791 Long term (current) use of non-steroidal anti-inflammatories (NSAID): Secondary | ICD-10-CM | POA: Insufficient documentation

## 2020-05-14 DIAGNOSIS — N840 Polyp of corpus uteri: Secondary | ICD-10-CM | POA: Diagnosis not present

## 2020-05-14 DIAGNOSIS — N841 Polyp of cervix uteri: Secondary | ICD-10-CM | POA: Insufficient documentation

## 2020-05-14 DIAGNOSIS — N921 Excessive and frequent menstruation with irregular cycle: Secondary | ICD-10-CM

## 2020-05-14 DIAGNOSIS — Z7982 Long term (current) use of aspirin: Secondary | ICD-10-CM | POA: Insufficient documentation

## 2020-05-14 DIAGNOSIS — Z79899 Other long term (current) drug therapy: Secondary | ICD-10-CM | POA: Insufficient documentation

## 2020-05-14 DIAGNOSIS — Z793 Long term (current) use of hormonal contraceptives: Secondary | ICD-10-CM | POA: Insufficient documentation

## 2020-05-14 DIAGNOSIS — Z7952 Long term (current) use of systemic steroids: Secondary | ICD-10-CM | POA: Insufficient documentation

## 2020-05-14 DIAGNOSIS — N946 Dysmenorrhea, unspecified: Secondary | ICD-10-CM | POA: Diagnosis not present

## 2020-05-14 DIAGNOSIS — J449 Chronic obstructive pulmonary disease, unspecified: Secondary | ICD-10-CM | POA: Insufficient documentation

## 2020-05-14 DIAGNOSIS — Z885 Allergy status to narcotic agent status: Secondary | ICD-10-CM | POA: Insufficient documentation

## 2020-05-14 DIAGNOSIS — N920 Excessive and frequent menstruation with regular cycle: Secondary | ICD-10-CM | POA: Insufficient documentation

## 2020-05-14 DIAGNOSIS — Z888 Allergy status to other drugs, medicaments and biological substances status: Secondary | ICD-10-CM | POA: Insufficient documentation

## 2020-05-14 DIAGNOSIS — Z8249 Family history of ischemic heart disease and other diseases of the circulatory system: Secondary | ICD-10-CM | POA: Insufficient documentation

## 2020-05-14 DIAGNOSIS — Z881 Allergy status to other antibiotic agents status: Secondary | ICD-10-CM | POA: Insufficient documentation

## 2020-05-14 DIAGNOSIS — N938 Other specified abnormal uterine and vaginal bleeding: Secondary | ICD-10-CM

## 2020-05-14 DIAGNOSIS — F17210 Nicotine dependence, cigarettes, uncomplicated: Secondary | ICD-10-CM | POA: Insufficient documentation

## 2020-05-14 DIAGNOSIS — Z20822 Contact with and (suspected) exposure to covid-19: Secondary | ICD-10-CM | POA: Insufficient documentation

## 2020-05-14 HISTORY — PX: DILATATION AND CURETTAGE/HYSTEROSCOPY WITH MINERVA: SHX6851

## 2020-05-14 SURGERY — DILATATION AND CURETTAGE/HYSTEROSCOPY WITH MINERVA
Anesthesia: General | Site: Vagina

## 2020-05-14 MED ORDER — ONDANSETRON HCL 4 MG/2ML IJ SOLN
INTRAMUSCULAR | Status: AC
  Filled 2020-05-14: qty 8

## 2020-05-14 MED ORDER — DEXAMETHASONE SODIUM PHOSPHATE 10 MG/ML IJ SOLN
INTRAMUSCULAR | Status: AC
  Filled 2020-05-14: qty 2

## 2020-05-14 MED ORDER — MIDAZOLAM HCL 2 MG/2ML IJ SOLN
INTRAMUSCULAR | Status: AC
  Filled 2020-05-14: qty 2

## 2020-05-14 MED ORDER — POVIDONE-IODINE 10 % EX SWAB: 2.0000 "application " | Freq: Once | CUTANEOUS | Status: DC

## 2020-05-14 MED ORDER — FENTANYL CITRATE (PF) 100 MCG/2ML IJ SOLN
INTRAMUSCULAR | Status: AC
  Filled 2020-05-14: qty 2

## 2020-05-14 MED ORDER — KETOROLAC TROMETHAMINE 30 MG/ML IJ SOLN
30.0000 mg | Freq: Once | INTRAMUSCULAR | Status: AC
  Administered 2020-05-14: 30 mg via INTRAVENOUS
  Filled 2020-05-14: qty 1

## 2020-05-14 MED ORDER — FENTANYL CITRATE (PF) 100 MCG/2ML IJ SOLN
25.0000 ug | INTRAMUSCULAR | Status: DC | PRN
  Administered 2020-05-14 (×2): 50 ug via INTRAVENOUS
  Filled 2020-05-14: qty 2

## 2020-05-14 MED ORDER — 0.9 % SODIUM CHLORIDE (POUR BTL) OPTIME
TOPICAL | Status: DC | PRN
  Administered 2020-05-14: 1000 mL

## 2020-05-14 MED ORDER — ONDANSETRON 8 MG PO TBDP: 8.0000 mg | ORAL_TABLET | Freq: Three times a day (TID) | ORAL | 0 refills | Status: DC | PRN

## 2020-05-14 MED ORDER — LIDOCAINE HCL (CARDIAC) PF 100 MG/5ML IV SOSY
PREFILLED_SYRINGE | INTRAVENOUS | Status: DC | PRN
  Administered 2020-05-14: 60 mg via INTRAVENOUS

## 2020-05-14 MED ORDER — SEVOFLURANE IN SOLN
RESPIRATORY_TRACT | Status: AC
  Filled 2020-05-14: qty 250

## 2020-05-14 MED ORDER — FENTANYL CITRATE (PF) 250 MCG/5ML IJ SOLN
INTRAMUSCULAR | Status: DC | PRN
  Administered 2020-05-14: 25 ug via INTRAVENOUS
  Administered 2020-05-14 (×2): 50 ug via INTRAVENOUS
  Administered 2020-05-14: 25 ug via INTRAVENOUS
  Administered 2020-05-14: 50 ug via INTRAVENOUS

## 2020-05-14 MED ORDER — MIDAZOLAM HCL 2 MG/2ML IJ SOLN
INTRAMUSCULAR | Status: DC | PRN
  Administered 2020-05-14: 2 mg via INTRAVENOUS

## 2020-05-14 MED ORDER — PROPOFOL 10 MG/ML IV BOLUS
INTRAVENOUS | Status: AC
  Filled 2020-05-14: qty 40

## 2020-05-14 MED ORDER — KETOROLAC TROMETHAMINE 10 MG PO TABS: 10.0000 mg | ORAL_TABLET | Freq: Three times a day (TID) | ORAL | 0 refills | Status: DC | PRN

## 2020-05-14 MED ORDER — CHLORHEXIDINE GLUCONATE 0.12 % MT SOLN
15.0000 mL | Freq: Once | OROMUCOSAL | Status: AC
  Administered 2020-05-14: 15 mL via OROMUCOSAL

## 2020-05-14 MED ORDER — LACTATED RINGERS IV SOLN
INTRAVENOUS | Status: DC
  Administered 2020-05-14: 1000 mL via INTRAVENOUS

## 2020-05-14 MED ORDER — ONDANSETRON HCL 4 MG/2ML IJ SOLN
INTRAMUSCULAR | Status: DC | PRN
  Administered 2020-05-14: 4 mg via INTRAVENOUS

## 2020-05-14 MED ORDER — DEXAMETHASONE SODIUM PHOSPHATE 10 MG/ML IJ SOLN
INTRAMUSCULAR | Status: DC | PRN
  Administered 2020-05-14: 4 mg via INTRAVENOUS

## 2020-05-14 MED ORDER — ORAL CARE MOUTH RINSE: 15.0000 mL | Freq: Once | OROMUCOSAL | Status: AC

## 2020-05-14 MED ORDER — ONDANSETRON HCL 4 MG/2ML IJ SOLN: 4.0000 mg | Freq: Once | INTRAMUSCULAR | Status: DC | PRN

## 2020-05-14 MED ORDER — PROPOFOL 10 MG/ML IV BOLUS
INTRAVENOUS | Status: DC | PRN
  Administered 2020-05-14: 160 mg via INTRAVENOUS

## 2020-05-14 MED ORDER — CEFAZOLIN SODIUM-DEXTROSE 2-4 GM/100ML-% IV SOLN
2.0000 g | INTRAVENOUS | Status: AC
  Administered 2020-05-14: 2 g via INTRAVENOUS
  Filled 2020-05-14: qty 100

## 2020-05-14 MED ORDER — SODIUM CHLORIDE 0.9 % IR SOLN
Status: DC | PRN
  Administered 2020-05-14: 1000 mL

## 2020-05-14 SURGICAL SUPPLY — 28 items
BAG HAMPER (MISCELLANEOUS) ×2 IMPLANT
CLOTH BEACON ORANGE TIMEOUT ST (SAFETY) ×2 IMPLANT
COVER LIGHT HANDLE STERIS (MISCELLANEOUS) ×4 IMPLANT
COVER WAND RF STERILE (DRAPES) ×2 IMPLANT
GAUZE 4X4 16PLY RFD (DISPOSABLE) ×4 IMPLANT
GLOVE ECLIPSE 8.0 STRL XLNG CF (GLOVE) ×2 IMPLANT
GLOVE SRG 8 PF TXTR STRL LF DI (GLOVE) ×1 IMPLANT
GLOVE SURG ENC MOIS LTX SZ7 (GLOVE) ×1 IMPLANT
GLOVE SURG UNDER POLY LF SZ7 (GLOVE) ×4 IMPLANT
GLOVE SURG UNDER POLY LF SZ8 (GLOVE) ×2
GOWN STRL REUS W/TWL LRG LVL3 (GOWN DISPOSABLE) ×2 IMPLANT
GOWN STRL REUS W/TWL XL LVL3 (GOWN DISPOSABLE) ×2 IMPLANT
HANDPIECE ABLA MINERVA ENDO (MISCELLANEOUS) ×2 IMPLANT
INST SET HYSTEROSCOPY (KITS) ×2 IMPLANT
IV NS 1000ML (IV SOLUTION) ×2
IV NS 1000ML BAXH (IV SOLUTION) ×1 IMPLANT
KIT TURNOVER CYSTO (KITS) ×2 IMPLANT
MANIFOLD NEPTUNE II (INSTRUMENTS) ×2 IMPLANT
MARKER SKIN DUAL TIP RULER LAB (MISCELLANEOUS) ×2 IMPLANT
NS IRRIG 1000ML POUR BTL (IV SOLUTION) ×2 IMPLANT
PACK BASIC III (CUSTOM PROCEDURE TRAY) ×2
PACK SRG BSC III STRL LF ECLPS (CUSTOM PROCEDURE TRAY) ×1 IMPLANT
PAD ARMBOARD 7.5X6 YLW CONV (MISCELLANEOUS) ×2 IMPLANT
PAD TELFA 3X4 1S STER (GAUZE/BANDAGES/DRESSINGS) ×2 IMPLANT
SET BASIN LINEN APH (SET/KITS/TRAYS/PACK) ×2 IMPLANT
SET IRRIG Y TYPE TUR BLADDER L (SET/KITS/TRAYS/PACK) ×2 IMPLANT
SHEET LAVH (DRAPES) ×2 IMPLANT
YANKAUER SUCT BULB TIP 10FT TU (MISCELLANEOUS) ×2 IMPLANT

## 2020-05-14 NOTE — Anesthesia Preprocedure Evaluation (Signed)
Anesthesia Evaluation  Patient identified by MRN, date of birth, ID band Patient awake    Reviewed: Allergy & Precautions, H&P , NPO status , Patient's Chart, lab work & pertinent test results, reviewed documented beta blocker date and time   Airway Mallampati: II  TM Distance: >3 FB Neck ROM: full    Dental no notable dental hx.    Pulmonary COPD, Current Smoker and Patient abstained from smoking.,    Pulmonary exam normal breath sounds clear to auscultation       Cardiovascular Exercise Tolerance: Good hypertension, negative cardio ROS   Rhythm:regular Rate:Normal     Neuro/Psych PSYCHIATRIC DISORDERS Anxiety negative neurological ROS     GI/Hepatic negative GI ROS, Neg liver ROS,   Endo/Other  negative endocrine ROS  Renal/GU negative Renal ROS  negative genitourinary   Musculoskeletal   Abdominal   Peds  Hematology negative hematology ROS (+)   Anesthesia Other Findings   Reproductive/Obstetrics negative OB ROS                             Anesthesia Physical Anesthesia Plan  ASA: II  Anesthesia Plan: General LMA and General   Post-op Pain Management:    Induction:   PONV Risk Score and Plan: Ondansetron  Airway Management Planned:   Additional Equipment:   Intra-op Plan:   Post-operative Plan:   Informed Consent: I have reviewed the patients History and Physical, chart, labs and discussed the procedure including the risks, benefits and alternatives for the proposed anesthesia with the patient or authorized representative who has indicated his/her understanding and acceptance.     Dental Advisory Given  Plan Discussed with: CRNA  Anesthesia Plan Comments:         Anesthesia Quick Evaluation

## 2020-05-14 NOTE — Progress Notes (Signed)
Sherry from lab called to short stay postoperative desk and indicated patient was to have a purple top tube collected as confirmation of blood type. PACU Becky DIgges Rn notified.

## 2020-05-14 NOTE — Anesthesia Postprocedure Evaluation (Signed)
Anesthesia Post Note  Patient: Lorena Benham  Procedure(s) Performed: DILATATION AND CURETTAGE/HYSTEROSCOPY WITH MINERVA (N/A Vagina )  Patient location during evaluation: PACU Anesthesia Type: General Level of consciousness: awake and alert and oriented Pain management: pain level controlled Vital Signs Assessment: post-procedure vital signs reviewed and stable Respiratory status: spontaneous breathing and respiratory function stable Cardiovascular status: blood pressure returned to baseline and stable Postop Assessment: no apparent nausea or vomiting Anesthetic complications: no   No complications documented.   Last Vitals:  Vitals:   05/14/20 1000 05/14/20 1015  BP: 139/78 (!) 146/71  Pulse: 63 63  Resp: 18 18  Temp:  36.8 C  SpO2: 100% 100%    Last Pain:  Vitals:   05/14/20 1015  TempSrc: Oral  PainSc: 5                  Karna Dupes

## 2020-05-14 NOTE — Interval H&P Note (Signed)
History and Physical Interval Note:  05/14/2020 7:54 AM  Robin Baldwin  has presented today for surgery, with the diagnosis of Menometrorrhagia; Dysmenorrhea.  The various methods of treatment have been discussed with the patient and family. After consideration of risks, benefits and other options for treatment, the patient has consented to  Procedure(s): DILATATION AND CURETTAGE/HYSTEROSCOPY WITH MINERVA (N/A) as a surgical intervention.  The patient's history has been reviewed, patient examined, no change in status, stable for surgery.  I have reviewed the patient's chart and labs.  Questions were answered to the patient's satisfaction.     Florian Buff

## 2020-05-14 NOTE — H&P (Signed)
Preoperative History and Physical  Robin Baldwin is a 50 y.o. G2P0020 with No LMP recorded. (Menstrual status: Perimenopausal). admitted for a increasing problematic menstrual periods, starts at any time, could bleed 3 weeks at a time, heavy, clots, cramps.  Sonogram reveals an endometrial polyp small fibroids Discussed options and desire to remove polyp Will schedule hysteroscopy uterine curettage removal of polyp Minerva endometrial ablation  PMH:        Past Medical History:  Diagnosis Date  . COPD (chronic obstructive pulmonary disease) ([place])   . Hypertension   . Mental disorder     PSH:          Past Surgical History:  Procedure Laterality Date  . CHOLECYSTECTOMY    . DILATION AND CURETTAGE OF UTERUS    . TUBAL LIGATION      POb/GynH:              OB History    Gravida  2   Para      Term      Preterm      AB  2   Living  0     SAB  2   IAB      Ectopic      Multiple      Live Births              SH:   Social History        Tobacco Use  . Smoking status: Current Every Day Smoker    Packs/day: 0.50    Types: Cigarettes  . Smokeless tobacco: Never Used  Vaping Use  . Vaping Use: Some days  Substance Use Topics  . Alcohol use: Yes    Comment: occasionally   . Drug use: No    FH:         Family History  Problem Relation Age of Onset  . Heart attack Father   . Heart failure Mother   . Drug abuse Brother        drug over dose  . Heart attack Brother      Allergies:       Allergies  Allergen Reactions  . Elemental Sulfur Hives and Other (See Comments)    Flushing of skin, burning sensation, blisters  . Benzonatate Other (See Comments)    Chest tightness  . Clindamycin/Lincomycin Hives  . Codeine Itching    Medications:       Current Outpatient Medications:  .  Acetaminophen (TYLENOL ARTHRITIS EXT RELIEF PO), Take 1 tablet by mouth daily as needed (for pain)., Disp: , Rfl:  .   albuterol (PROVENTIL) (2.5 MG/3ML) 0.083% nebulizer solution, Take 2.5 mg by nebulization every 6 (six) hours as needed for wheezing or shortness of breath., Disp: , Rfl:  .  albuterol (VENTOLIN HFA) 108 (90 Base) MCG/ACT inhaler, Inhale 1-2 puffs into the lungs every 6 (six) hours as needed for wheezing or shortness of breath. , Disp: , Rfl:  .  ALPRAZolam (XANAX) 0.25 MG tablet, Take 0.25 mg by mouth 2 (two) times daily., Disp: , Rfl:  .  aspirin EC 81 MG tablet, Take 81 mg by mouth daily., Disp: , Rfl:  .  cyanocobalamin 100 MCG tablet, Take 100 mcg by mouth daily., Disp: , Rfl:  .  escitalopram (LEXAPRO) 10 MG tablet, Take 10 mg by mouth daily., Disp: , Rfl:  .  Ferrous Gluconate (IRON 27 PO), Take 1 tablet by mouth See admin instructions. Takes daily during menstrual, Disp: , Rfl:  .  Fluticasone-Umeclidin-Vilant (TRELEGY ELLIPTA) 100-62.5-25 MCG/INH AEPB, Inhale 1 puff into the lungs daily., Disp: , Rfl:  .  megestrol (MEGACE) 40 MG tablet, 3 tablets a day for 5 days, 2 tablets a day for 5 days then 1 tablet daily, Disp: 45 tablet, Rfl: 3 .  methylPREDNISolone (MEDROL DOSEPAK) 4 MG TBPK tablet, Use as directed, Disp: 21 tablet, Rfl: 0 .  Multiple Vitamin (MULTIVITAMIN) tablet, Take 1 tablet by mouth daily., Disp: , Rfl:  .  naproxen (NAPROSYN) 500 MG tablet, Take 1 tablet (500 mg total) by mouth 2 (two) times daily., Disp: 30 tablet, Rfl: 0 .  norethindrone (AYGESTIN) 5 MG tablet, Take 1 tablet (5 mg total) by mouth daily., Disp: 30 tablet, Rfl: 2 .  ondansetron (ZOFRAN-ODT) 4 MG disintegrating tablet, Take 4 mg by mouth every 8 (eight) hours as needed., Disp: , Rfl:  .  oxyCODONE-acetaminophen (PERCOCET/ROXICET) 5-325 MG tablet, Take 1 tablet by mouth 2 (two) times a day., Disp: , Rfl:  .  pantoprazole (PROTONIX) 40 MG tablet, Take 40 mg by mouth 2 (two) times daily., Disp: , Rfl:   Review of Systems:   Review of Systems  Constitutional: Negative for fever, chills, weight loss,  malaise/fatigue and diaphoresis.  HENT: Negative for hearing loss, ear pain, nosebleeds, congestion, sore throat, neck pain, tinnitus and ear discharge.   Eyes: Negative for blurred vision, double vision, photophobia, pain, discharge and redness.  Respiratory: Negative for cough, hemoptysis, sputum production, shortness of breath, wheezing and stridor.   Cardiovascular: Negative for chest pain, palpitations, orthopnea, claudication, leg swelling and PND.  Gastrointestinal: Positive for abdominal pain. Negative for heartburn, nausea, vomiting, diarrhea, constipation, blood in stool and melena.  Genitourinary: Negative for dysuria, urgency, frequency, hematuria and flank pain.  Musculoskeletal: Negative for myalgias, back pain, joint pain and falls.  Skin: Negative for itching and rash.  Neurological: Negative for dizziness, tingling, tremors, sensory change, speech change, focal weakness, seizures, loss of consciousness, weakness and headaches.  Endo/Heme/Allergies: Negative for environmental allergies and polydipsia. Does not bruise/bleed easily.  Psychiatric/Behavioral: Negative for depression, suicidal ideas, hallucinations, memory loss and substance abuse. The patient is not nervous/anxious and does not have insomnia.      PHYSICAL EXAM:  Blood pressure (!) 146/87, pulse 73, height 5\' 6"  (1.676 m), weight 146 lb 8 oz (66.5 kg).    Vitals reviewed. Constitutional: She is oriented to person, place, and time. She appears well-developed and well-nourished.  HENT:  Head: Normocephalic and atraumatic.  Right Ear: External ear normal.  Left Ear: External ear normal.  Nose: Nose normal.  Mouth/Throat: Oropharynx is clear and moist.  Eyes: Conjunctivae and EOM are normal. Pupils are equal, round, and reactive to light. Right eye exhibits no discharge. Left eye exhibits no discharge. No scleral icterus.  Neck: Normal range of motion. Neck supple. No tracheal deviation present. No  thyromegaly present.  Cardiovascular: Normal rate, regular rhythm, normal heart sounds and intact distal pulses.  Exam reveals no gallop and no friction rub.   No murmur heard. Respiratory: Effort normal and breath sounds normal. No respiratory distress. She has no wheezes. She has no rales. She exhibits no tenderness.  GI: Soft. Bowel sounds are normal. She exhibits no distension and no mass. There is tenderness. There is no rebound and no guarding.  Genitourinary:       Vulva is normal without lesions Vagina is pink moist without discharge Cervix normal in appearance and pap is normal Uterus is normal size, contour, position, consistency, mobility, non-tender  Adnexa is negative with normal sized ovaries by sonogram  Musculoskeletal: Normal range of motion. She exhibits no edema and no tenderness.  Neurological: She is alert and oriented to person, place, and time. She has normal reflexes. She displays normal reflexes. No cranial nerve deficit. She exhibits normal muscle tone. Coordination normal.  Skin: Skin is warm and dry. No rash noted. No erythema. No pallor.  Psychiatric: She has a normal mood and affect. Her behavior is normal. Judgment and thought content normal.    Labs: Results for orders placed or performed during the hospital encounter of 05/12/20 (from the past 72 hour(s))  SARS CORONAVIRUS 2 (TAT 6-24 HRS) Nasopharyngeal Nasopharyngeal Swab     Status: None   Collection Time: 05/12/20  1:03 PM   Specimen: Nasopharyngeal Swab  Result Value Ref Range   SARS Coronavirus 2 NEGATIVE NEGATIVE    Comment: (NOTE) SARS-CoV-2 target nucleic acids are NOT DETECTED.  The SARS-CoV-2 RNA is generally detectable in upper and lower respiratory specimens during the acute phase of infection. Negative results do not preclude SARS-CoV-2 infection, do not rule out co-infections with other pathogens, and should not be used as the sole basis for treatment or other patient management  decisions. Negative results must be combined with clinical observations, patient history, and epidemiological information. The expected result is Negative.  Fact Sheet for Patients: SugarRoll.be  Fact Sheet for Healthcare Providers: https://www.woods-mathews.com/  This test is not yet approved or cleared by the [place] FDA and  has been authorized for detection and/or diagnosis of SARS-CoV-2 by FDA under an Emergency Use Authorization (EUA). This EUA will remain  in effect (meaning this test can be used) for the duration of the COVID-19 declaration under Se ction 564(b)(1) of the Act, 21 U.S.C. section 360bbb-3(b)(1), unless the authorization is terminated or revoked sooner.  Performed at [facility], [place] [address], [place], [place] [address]   Type and screen     Status: None   Collection Time: 05/12/20  3:36 PM  Result Value Ref Range   ABO/RH(D) O POS    Antibody Screen NEG    Sample Expiration      05/26/2020,2359 Performed at [facility], [address], [place], [place] [address]   CBC     Status: None   Collection Time: 05/12/20  3:37 PM  Result Value Ref Range   WBC 5.6 4.0 - 10.5 K/uL   RBC 4.28 3.87 - 5.11 MIL/uL   Hemoglobin 14.1 12.0 - 15.0 g/dL   HCT 41.3 36.0 - 46.0 %   MCV 96.5 80.0 - 100.0 fL   MCH 32.9 26.0 - 34.0 pg   MCHC 34.1 30.0 - 36.0 g/dL   RDW 11.6 11.5 - 15.5 %   Platelets 230 150 - 400 K/uL   nRBC 0.0 0.0 - 0.2 %    Comment: Performed at [facility], [address] [place], [place] [address]  Comprehensive metabolic panel     Status: None   Collection Time: 05/12/20  3:37 PM  Result Value Ref Range   Sodium 135 135 - 145 mmol/L   Potassium 4.2 3.5 - 5.1 mmol/L   Chloride 105 98 - 111 mmol/L   CO2 23 22 - 32 mmol/L   Glucose, Bld 75 70 - 99 mg/dL    Comment: Glucose reference range applies only to samples taken after fasting for at least 8 hours.   BUN 11 6 - 20 mg/dL   Creatinine,  Ser 0.66 0.44 -  1.00 mg/dL   Calcium 9.0 8.9 - 10.3 mg/dL   Total Protein 7.2 6.5 - 8.1 g/dL   Albumin 4.2 3.5 - 5.0 g/dL   AST 22 15 - 41 U/L   ALT 17 0 - 44 U/L   Alkaline Phosphatase 45 38 - 126 U/L   Total Bilirubin 0.3 0.3 - 1.2 mg/dL   GFR, Estimated >60 >60 mL/min    Comment: (NOTE) Calculated using the CKD-EPI Creatinine Equation (2021)    Anion gap 7 5 - 15    Comment: Performed at [facility], [address], [place], [place] [address]  hCG, quantitative, pregnancy     Status: None   Collection Time: 05/12/20  3:37 PM  Result Value Ref Range   hCG, Beta Chain, Quant, S <1 <5 mIU/mL    Comment:          GEST. AGE      CONC.  (mIU/mL)   <=1 WEEK        5 - 50     2 WEEKS       50 - 500     3 WEEKS       100 - 10,000     4 WEEKS     1,000 - 30,000     5 WEEKS     3,500 - 115,000   6-8 WEEKS     12,000 - 270,000    12 WEEKS     15,000 - 220,000        FEMALE AND NON-PREGNANT FEMALE:     LESS THAN 5 mIU/mL Performed at [facility], [address] [place], [place] [address]   Rapid HIV screen (HIV 1/2 Ab+Ag)     Status: None   Collection Time: 05/12/20  3:37 PM  Result Value Ref Range   HIV-1 P24 Antigen - HIV24 NON REACTIVE NON REACTIVE    Comment: (NOTE) Detection of p24 may be inhibited by biotin in the sample, causing false negative results in acute infection.    HIV 1/2 Antibodies NON REACTIVE NON REACTIVE   Interpretation (HIV Ag Ab)      A non reactive test result means that HIV 1 or HIV 2 antibodies and HIV 1 p24 antigen were not detected in the specimen.    Comment: Performed at [facility], [address] [place],  [address]  Urinalysis, Routine w reflex microscopic Urine, Clean Catch     Status: Abnormal   Collection Time: 05/12/20  3:38 PM  Result Value Ref Range   Color, Urine YELLOW YELLOW   APPearance HAZY (A) CLEAR   Specific Gravity, Urine 1.010 1.005 - 1.030   pH 5.0 5.0 - 8.0   Glucose, UA NEGATIVE NEGATIVE mg/dL   Hgb urine dipstick  NEGATIVE NEGATIVE   Bilirubin Urine NEGATIVE NEGATIVE   Ketones, ur NEGATIVE NEGATIVE mg/dL   Protein, ur NEGATIVE NEGATIVE mg/dL   Nitrite NEGATIVE NEGATIVE   Leukocytes,Ua NEGATIVE NEGATIVE    Comment: Performed at [address] [place],  [address]    EKG:    Orders placed or performed during the hospital encounter of 09/27/18  . EKG 12-Lead  . EKG 12-Lead  . EKG    Imaging Studies:  Imaging Results  US PELVIC COMPLETE WITH TRANSVAGINAL  Result Date: 03/28/2020 CLINICAL DATA:  Menorrhagia, bleeding for over a month, pain, history of fibroids EXAM: TRANSABDOMINAL AND TRANSVAGINAL ULTRASOUND OF PELVIS TECHNIQUE: Both transabdominal and transvaginal ultrasound examinations of the pelvis were performed. Transabdominal technique  was performed for global imaging of the pelvis including uterus, ovaries, adnexal regions, and pelvic cul-de-sac. It was necessary to proceed with endovaginal exam following the transabdominal exam to visualize the uterus, endometrium, and ovaries. COMPARISON:  01/21/2016 FINDINGS: Uterus Measurements: 8.8 x 3.5 x 5.9 cm = volume: 95 mL. Anteverted. Heterogeneous myometrium. Small exophytic leiomyoma from posterior uterus 19 x 11 x 15 mm. Additional anterior wall leiomyoma at fundus 2.8 x 2.6 x 2.5 cm, exophytic. No additional uterine masses. Endometrium Thickness: 5 mm. Question polyp at upper uterine segment 10 x 5 x 13 mm. No definite endometrial fluid. Right ovary Not visualized, likely obscured by bowel Left ovary Not visualized, likely obscured by bowel Other findings No free pelvic fluid.  No adnexal masses. IMPRESSION: Question endometrial polyp at upper uterine segment 10 x 5 x 13 mm; Consider further evaluation with sonohysterogram for confirmation prior to hysteroscopy. Endometrial sampling should also be considered if patient is at high risk for endometrial carcinoma. (Ref: Radiological Reasoning: Algorithmic Workup of Abnormal Vaginal  Bleeding with Endovaginal Sonography and Sonohysterography. AJR 2008; [id number]:A25-05) Two small exophytic uterine leiomyomata. Electronically Signed   By: Lavonia Dana M.D.   On: 03/28/2020 17:17       Assessment: Menometrorrhagia Dysmenorrhea Endometrial polyp  Plan: Hysteroscopy uterine curettage Minerva endometrial ablation removal of polyp  Florian Buff 04/17/2020 3:08 PM

## 2020-05-14 NOTE — Discharge Instructions (Signed)
Endometrial Ablation Endometrial ablation is a procedure that destroys the thin inner layer of the lining of the uterus (endometrium). This procedure may be done:  To stop heavy menstrual periods.  To stop bleeding that is causing anemia.  To control irregular bleeding.  To treat bleeding caused by small tumors (fibroids) in the endometrium. This procedure is often done as an alternative to major surgery, such as removal of the uterus and cervix (hysterectomy). As a result of this procedure:  You may not be able to have children. However, if you have not yet gone through menopause: ? You may still have a small chance of getting pregnant. ? You will need to use a reliable method of birth control after the procedure to prevent pregnancy.  You may stop having a menstrual period, or you may have only a small amount of bleeding during your period. Menstruation may return several years after the procedure. Tell a health care provider about:  Any allergies you have.  All medicines you are taking, including vitamins, herbs, eye drops, creams, and over-the-counter medicines.  Any problems you or family members have had with the use of anesthetic medicines.  Any blood disorders you have.  Any surgeries you have had.  Any medical conditions you have.  Whether you are pregnant or may be pregnant. What are the risks? Generally, this is a safe procedure. However, problems may occur, including:  A hole (perforation) in the uterus or bowel.  Infection in the uterus, bladder, or vagina.  Bleeding.  Allergic reaction to medicines.  Damage to nearby structures or organs.  An air bubble in the lung (air embolus).  Problems with pregnancy.  Failure of the procedure.  Decreased ability to diagnose cancer in the endometrium. Scar tissue forms after the procedure, making it more difficult to get a sample of the uterine lining. What happens before the procedure? Medicines Ask your health  care provider about:  Changing or stopping your regular medicines. This is especially important if you take diabetes medicines or blood thinners.  Taking medicines such as aspirin and ibuprofen. These medicines can thin your blood. Do not take these medicines before your procedure if your doctor tells you not to take them.  Taking over-the-counter medicines, vitamins, herbs, and supplements. Tests  You will have tests of your endometrium to make sure there are no precancerous cells or cancer cells present.  You may have an ultrasound of the uterus. General instructions  Do not use any products that contain nicotine or tobacco for at least 4 weeks before the procedure. These include cigarettes, chewing tobacco, and vaping devices, such as e-cigarettes. If you need help quitting, ask your health care provider.  You may be given medicines to thin the endometrium.  Ask your health care provider what steps will be taken to help prevent infection. These steps may include: ? Removing hair at the surgery site. ? Washing skin with a germ-killing soap. ? Taking antibiotic medicine.  Plan to have a responsible adult take you home from the hospital or clinic.  Plan to have a responsible adult care for you for the time you are told after you leave the hospital or clinic. This is important. What happens during the procedure?  You will lie on an exam table with your feet and legs supported as in a pelvic exam.  An IV will be inserted into one of your veins.  You will be given a medicine to help you relax (sedative).  A surgical tool with   a light and camera (resectoscope) will be inserted into your vagina and moved into your uterus. This allows your surgeon to see inside your uterus.  Endometrial tissue will be destroyed and removed, using one of the following methods: ? Radiofrequency. This uses an electrical current to destroy the endometrium. ? Cryotherapy. This uses extreme cold to freeze  the endometrium. ? Heated fluid. This uses a heated salt and water (saline) solution to destroy the endometrium. ? Microwave. This uses high-energy microwaves to heat up the endometrium and destroy it. ? Thermal balloon. This involves inserting a catheter with a balloon tip into the uterus. The balloon tip is filled with heated fluid to destroy the endometrium. The procedure may vary among health care providers and hospitals.   What happens after the procedure?  Your blood pressure, heart rate, breathing rate, and blood oxygen level will be monitored until you leave the hospital or clinic.  You may have vaginal bleeding for 4-6 weeks after the procedure. You may also have: ? Cramps. ? A thin, watery vaginal discharge that is light pink or brown. ? A need to urinate more than usual. ? Nausea.  If you were given a sedative during the procedure, it can affect you for several hours. Do not drive or operate machinery until your health care provider says that it is safe.  Do not have sex or insert anything into your vagina until your health care provider says it is safe. Summary  Endometrial ablation is done to treat many causes of heavy menstrual bleeding. The procedure destroys the thin inner layer of the lining of the uterus (endometrium).  This procedure is often done as an alternative to major surgery, such as removal of the uterus and cervix (hysterectomy).  Plan to have a responsible adult take you home from the hospital or clinic. This information is not intended to replace advice given to you by your health care provider. Make sure you discuss any questions you have with your health care provider. Document Revised: 09/27/2019 Document Reviewed: 09/27/2019 Elsevier Patient Education  [place] Anesthesia, Adult, Care After This sheet gives you information about how to care for yourself after your procedure. Your health care provider may also give you more  specific instructions. If you have problems or questions, contact your health care provider. What can I expect after the procedure? After the procedure, the following side effects are common:  Pain or discomfort at the IV site.  Nausea.  Vomiting.  Sore throat.  Trouble concentrating.  Feeling cold or chills.  Feeling weak or tired.  Sleepiness and fatigue.  Soreness and body aches. These side effects can affect parts of the body that were not involved in surgery. Follow these instructions at home: For the time period you were told by your health care provider:  Rest.  Do not participate in activities where you could fall or become injured.  Do not drive or use machinery.  Do not drink alcohol.  Do not take sleeping pills or medicines that cause drowsiness.  Do not make important decisions or sign legal documents.  Do not take care of children on your own.   Eating and drinking  Follow any instructions from your health care provider about eating or drinking restrictions.  When you feel hungry, start by eating small amounts of foods that are soft and easy to digest (bland), such as toast. Gradually return to your regular diet.  Drink enough fluid to keep your  urine pale yellow.  If you vomit, rehydrate by drinking water, juice, or clear broth. General instructions  If you have sleep apnea, surgery and certain medicines can increase your risk for breathing problems. Follow instructions from your health care provider about wearing your sleep device: ? Anytime you are sleeping, including during daytime naps. ? While taking prescription pain medicines, sleeping medicines, or medicines that make you drowsy.  Have a responsible adult stay with you for the time you are told. It is important to have someone help care for you until you are awake and alert.  Return to your normal activities as told by your health care provider. Ask your health care provider what activities  are safe for you.  Take over-the-counter and prescription medicines only as told by your health care provider.  If you smoke, do not smoke without supervision.  Keep all follow-up visits as told by your health care provider. This is important. Contact a health care provider if:  You have nausea or vomiting that does not get better with medicine.  You cannot eat or drink without vomiting.  You have pain that does not get better with medicine.  You are unable to pass urine.  You develop a skin rash.  You have a fever.  You have redness around your IV site that gets worse. Get help right away if:  You have difficulty breathing.  You have chest pain.  You have blood in your urine or stool, or you vomit blood. Summary  After the procedure, it is common to have a sore throat or nausea. It is also common to feel tired.  Have a responsible adult stay with you for the time you are told. It is important to have someone help care for you until you are awake and alert.  When you feel hungry, start by eating small amounts of foods that are soft and easy to digest (bland), such as toast. Gradually return to your regular diet.  Drink enough fluid to keep your urine pale yellow.  Return to your normal activities as told by your health care provider. Ask your health care provider what activities are safe for you. This information is not intended to replace advice given to you by your health care provider. Make sure you discuss any questions you have with your health care provider. Document Revised: 11/22/2019 Document Reviewed: 06/21/2019 Elsevier Patient Education  2021 [place].    Ketorolac Oral Tablets What is this medicine? KETOROLAC (kee toe ROLE ak) is a non-steroidal anti-inflammatory drug, also known as an NSAID. It treats pain, inflammation, and swelling. This medicine may be used for other purposes; ask your health care provider or pharmacist if you have  questions. COMMON BRAND NAME(S): Toradol What should I tell my health care provider before I take this medicine? They need to know if you have any of these conditions:  bleeding disorder  coronary artery bypass graft (CABG) within the past 2 weeks  heart attack  heart disease  heart failure  high blood pressure  if you often drink alcohol  kidney disease  liver disease  lung or breathing disease (asthma)  receiving steroids like dexamethasone or prednisone  smoke tobacco cigarettes  stomach bleeding  stomach ulcers, other stomach or intestine problems  take medicine to treat or prevent blood clots  an unusual or allergic reaction to ketorolac, other medicines, foods, dyes, or preservatives  pregnant or trying to get pregnant  breast-feeding How should I use this medicine? Take this  medicine by mouth. Take it as directed on the prescription label at the same time every day. You can take it with or without food. If it upsets your stomach, take it with food. There may be unused or extra doses in the bottle after you finish the dosing cycle. Talk to your health care provider if you have questions about your dose. A special MedGuide will be given to you by the pharmacist with each prescription and refill. Be sure to read this information carefully each time. Talk to your health care provider about the use of this medicine in children. Special care may be needed. Patients over 84 years of age may have a stronger reaction and need a smaller dose. Overdosage: If you think you have taken too much of this medicine contact a poison control center or emergency room at once. NOTE: This medicine is only for you. Do not share this medicine with others. What if I miss a dose? If you miss a dose, skip it. Take your next dose at the normal time. Do not take extra or 2 doses at the same time to make up for the missed dose. What may interact with this medicine? Do not take this medicine  with any of the following medications:  aspirin and aspirin-like medicines  cidofovir  methotrexate  NSAIDs, medicines for pain and inflammation, like ibuprofen or naproxen  pemetrexed  probenecid This medicine may also interact with the following medications:  alcohol  alendronate  alprazolam  carbamazepine  cyclosporine  diuretics  flavocoxid  fluoxetine  ginkgo  lithium  medicines for high blood pressure like enalapril  medicines that affect platelets like pentoxifylline  medicines that treat or prevent blood clots like heparin, warfarin  muscle relaxants  phenytoin  steroid medicines like prednisone or cortisone  thiothixene This list may not describe all possible interactions. Give your health care provider a list of all the medicines, herbs, non-prescription drugs, or dietary supplements you use. Also tell them if you smoke, drink alcohol, or use illegal drugs. Some items may interact with your medicine. What should I watch for while using this medicine? Visit your health care provider for regular checks on your progress. Tell your health care provider if your symptoms do not start to get better or if they get worse. Do not use this medicine for more than 5 days. It is only used for short-term treatment of moderate to severe pain. The risk of side effects such as kidney damage and stomach bleeding are higher if used for more than 5 days. Do not take other medicines that contain aspirin, ibuprofen, or naproxen with this medicine. Side effects such as stomach upset, nausea, or ulcers may be more likely to occur. Many non-prescription medicines contain aspirin, ibuprofen, or naproxen. Always read labels carefully. This medicine can cause serious ulcers and bleeding in the stomach. It can happen with no warning. Smoking, drinking alcohol, older age, and poor health can also increase risks. Call your health care provider right away if you have stomach pain or  blood in your vomit or stool. This medicine may cause serious skin reactions. They can happen weeks to months after starting the medicine. Contact your health care provider right away if you notice fevers or flu-like symptoms with a rash. The rash may be red or purple and then turn into blisters or peeling of the skin. Or, you might notice a red rash with swelling of the face, lips or lymph nodes in your neck or under your  arms. This medicine does not prevent a heart attack or stroke. This medicine may increase the chance of a heart attack or stroke. The chance may increase the longer you use this medicine or if you have heart disease. If you take aspirin to prevent a heart attack or stroke, talk to your health care provider about using this medicine. You may get drowsy or dizzy. Do not drive, use machinery, or do anything that needs mental alertness until you know how this medicine affects you. Do not stand up or sit up quickly, especially if you are an older patient. This reduces the risk of dizzy or fainting spells. What side effects may I notice from receiving this medicine? Side effects that you should report to your doctor or health care professional as soon as possible:  allergic reactions (skin rash, itching or hives; swelling of the face, lips, or tongue)  bleeding (bloody or black, tarry stools; red or dark brown urine; spitting up blood or brown material that looks like coffee grounds; red spots on the skin; unusual bruising or bleeding from the eyes, gums, or nose)  heart attack (trouble breathing; pain or tightness in the chest, neck, back or arms; unusually weak or tired)  heart failure (trouble breathing; fast, irregular heartbeat; sudden weight gain; swelling of the ankles, feet, hands; unusually weak or tired)  kidney injury (trouble passing urine or change in the amount of urine)  liver injury (dark yellow or brown urine; general ill feeling or flu-like symptoms; loss of appetite,  right upper belly pain; unusually weak or tired, yellowing of the eyes or skin)  low red blood cell counts (trouble breathing; feeling faint; lightheaded, falls; unusually weak or tired)  rash, fever, and swollen lymph nodes  redness, blistering, peeling, or loosening of the skin, including inside the mouth  stroke (changes in vision; confusion; trouble speaking or understanding; severe headaches; sudden numbness or weakness of the face, arm or leg; trouble walking; dizziness; loss of balance or coordination) Side effects that usually do not require medical attention (report to your doctor or health care professional if they continue or are bothersome):  constipation  decreased hearing, ringing in the ears  diarrhea  headache  nausea  passing gas  stomach pain  upset stomach This list may not describe all possible side effects. Call your doctor for medical advice about side effects. You may report side effects to FDA at 1-800-FDA-1088. Where should I keep my medicine? Keep out of the reach of children and pets. Store at room temperature between 15 and 30 degrees C (59 and 86 degrees F). Protect from moisture. Keep the container tightly closed. Protect from light. Get rid of any unused medicine after the expiration date. To get rid of medicines that are no longer needed or expired:  Take the medicine to a medicine take-back program. Check with your pharmacy or law enforcement to find a location.  If you cannot return the medicine, check the label or package insert to see if the medicine should be thrown out in the garbage or flushed down the toilet. If you are not sure, ask your health care provider. If it is safe to put in the trash, pour the medicine out of the container. Mix the medicine with cat litter, dirt, coffee grounds, or other unwanted substance. Seal the mixture in a bag or container. Put it in the trash. NOTE: This sheet is a summary. It may not cover all possible  information. If you have questions about this  medicine, talk to your doctor, pharmacist, or health care provider.  2021 Elsevier/Gold Standard (2019-07-27 10:29:52)

## 2020-05-14 NOTE — Transfer of Care (Signed)
Immediate Anesthesia Transfer of Care Note  Patient: Robin Baldwin  Procedure(s) Performed: DILATATION AND CURETTAGE/HYSTEROSCOPY WITH MINERVA (N/A Vagina )  Patient Location: PACU  Anesthesia Type:General  Level of Consciousness: drowsy  Airway & Oxygen Therapy: Patient Spontanous Breathing and Patient connected to nasal cannula oxygen  Post-op Assessment: Report given to RN and Post -op Vital signs reviewed and stable  Post vital signs: Reviewed and stable  Last Vitals:  Vitals Value Taken Time  BP 117/70   Temp    Pulse 64   Resp    SpO2 100%     Last Pain:  Vitals:   05/14/20 0721  TempSrc: Oral  PainSc: 0-No pain      Patients Stated Pain Goal: 6 (93/40/68 4033)  Complications: No complications documented.

## 2020-05-14 NOTE — Anesthesia Procedure Notes (Signed)
Procedure Name: LMA Insertion Date/Time: 05/14/2020 8:33 AM Performed by: Karna Dupes, CRNA Pre-anesthesia Checklist: Patient identified, Emergency Drugs available, Suction available and Patient being monitored Patient Re-evaluated:Patient Re-evaluated prior to induction Oxygen Delivery Method: Circle system utilized Preoxygenation: Pre-oxygenation with 100% oxygen Induction Type: IV induction LMA: LMA inserted LMA Size: 3.0 Number of attempts: 1 Tube secured with: Tape Dental Injury: Teeth and Oropharynx as per pre-operative assessment

## 2020-05-14 NOTE — Op Note (Signed)
Preoperative diagnosis:  1.   DUB                                         2.  Endometrial polyp   Postoperative diagnoses: Same as above + 4 endometrial polyps  Procedure: Hysteroscopy, uterine curettage, endometrial ablation using Minerva  Surgeon: Florian Buff   Anesthesia: Laryngeal mask airway  Findings: The endometrium was normal other than the 4 benign appearing polyps, 2 larger, 2 smaller. There were no fibroid or other abnormalities.  Description of operation: The patient was taken to the operating room and placed in the supine position. She underwent general anesthesia using the laryngeal mask airway. She was placed in the dorsal lithotomy position and prepped and draped in the usual sterile fashion. A Graves speculum was placed and the anterior cervical lip was grasped with a single-tooth tenaculum. The cervix was dilated serially to allow passage of the hysteroscope. Diagnostic hysteroscopy was performed and was found to be normal. A vigorous uterine curettage was then performed and all tissue sent to pathology for evaluation.  I then proceeded to perform the Minerva endometrial ablation.   The uterus sounded to 8.5 cm The handpiece was attached to the Minerva power source/machine and the handpiece passed the checklist. The array was squeezed down to remove all of the air present.  The array was then place into the endometrial cavity and deployed to a length of 5.5 cm. The handpiece confirmed appropriate width by being in the green portion of the visual dial. The cervical cuff was then inflated to the point the CO2 indicator was in the green. The endometrial integrity check was then performed and integrity sequence was confirmed x 2. The heating was then begun and carried out for a total of 2 minutes(which is standard therapy time). When the plasma cycle was finished,  the cervical cuff was deflated and the array was removed with tissue present on the silicon membrane. There was  appropriate post Minerva bleeding and uterine discharge.     All of the equipment worked well throughout the procedure.  The patient was awakened from anesthesia and taken to the recovery room in good stable condition all counts were correct. She received 2 g of Ancef and 30 mg of Toradol preoperatively. She will be discharged from the recovery room and followed up in the office in 1- 2 weeks.   She can expect 4 weeks of post procedure bloody watery discharge  Florian Buff, MD  05/14/2020 9:08 AM

## 2020-05-15 ENCOUNTER — Encounter (HOSPITAL_COMMUNITY): Payer: Self-pay | Admitting: Obstetrics & Gynecology

## 2020-05-15 LAB — SURGICAL PATHOLOGY

## 2020-05-23 ENCOUNTER — Other Ambulatory Visit: Payer: Self-pay

## 2020-05-23 ENCOUNTER — Ambulatory Visit (INDEPENDENT_AMBULATORY_CARE_PROVIDER_SITE_OTHER): Payer: BC Managed Care – PPO | Admitting: Obstetrics & Gynecology

## 2020-05-23 ENCOUNTER — Encounter: Payer: Self-pay | Admitting: Obstetrics & Gynecology

## 2020-05-23 VITALS — BP 156/89 | HR 68 | Wt 148.0 lb

## 2020-05-23 DIAGNOSIS — Z9889 Other specified postprocedural states: Secondary | ICD-10-CM

## 2020-05-23 NOTE — Progress Notes (Signed)
  HPI: Patient returns for routine postoperative follow-up having undergone hysteroscopy uterine curettage Minerva ablation on 05/14/20.  The patient's immediate postoperative recovery has been unremarkable. Since hospital discharge the patient reports no complaints.   Current Outpatient Medications: albuterol (PROVENTIL) (2.5 MG/3ML) 0.083% nebulizer solution, Take 2.5 mg by nebulization every 6 (six) hours as needed for wheezing or shortness of breath., Disp: , Rfl:  albuterol (VENTOLIN HFA) 108 (90 Base) MCG/ACT inhaler, Inhale 1-2 puffs into the lungs every 6 (six) hours as needed for wheezing or shortness of breath. , Disp: , Rfl:  ALPRAZolam (XANAX) 0.25 MG tablet, Take 0.25 mg by mouth 2 (two) times daily as needed for anxiety., Disp: , Rfl:  aspirin EC 81 MG tablet, Take 81 mg by mouth daily., Disp: , Rfl:  cyanocobalamin 100 MCG tablet, Take 100 mcg by mouth daily., Disp: , Rfl:  Ferrous Gluconate (IRON 27 PO), Take 1 tablet by mouth See admin instructions. Takes daily during menstrual, Disp: , Rfl:  Fluticasone-Umeclidin-Vilant (TRELEGY ELLIPTA) 100-62.5-25 MCG/INH AEPB, Inhale 1 puff into the lungs daily., Disp: , Rfl:  ketorolac (TORADOL) 10 MG tablet, Take 1 tablet (10 mg total) by mouth every 8 (eight) hours as needed., Disp: 15 tablet, Rfl: 0 Multiple Vitamin (MULTIVITAMIN) tablet, Take 1 tablet by mouth daily., Disp: , Rfl:  ondansetron (ZOFRAN ODT) 8 MG disintegrating tablet, Take 1 tablet (8 mg total) by mouth every 8 (eight) hours as needed for nausea or vomiting., Disp: 8 tablet, Rfl: 0 ondansetron (ZOFRAN-ODT) 4 MG disintegrating tablet, Take 4 mg by mouth every 8 (eight) hours as needed for nausea or vomiting., Disp: , Rfl:  oxyCODONE-acetaminophen (PERCOCET/ROXICET) 5-325 MG tablet, Take 1 tablet by mouth 2 (two) times daily as needed for moderate pain or severe pain., Disp: , Rfl:  pantoprazole (PROTONIX) 40 MG tablet, Take 40 mg by mouth 2 (two) times daily as needed (acid  reflux)., Disp: , Rfl:   No current facility-administered medications for this visit.    Blood pressure (!) 156/89, pulse 68, weight 148 lb (67.1 kg).  Physical Exam: General WDWN female NAD Vulva:  normal appearing vulva with no masses, tenderness or lesions Vagina:  normal mucosa, no discharge Cervix:  Normal no lesions Uterus:  normal size, contour, position, consistency, mobility, non-tender Adnexa: ovaries:present,  normal adnexa in size, nontender and no masses   Diagnostic Tests:   Pathology: Benign polyps  Impression:   ICD-10-CM   1. S/P endometrial ablation 05/14/20  Z61.096       Plan:     Follow up: prn   Florian Buff, MD

## 2020-12-04 DIAGNOSIS — F17200 Nicotine dependence, unspecified, uncomplicated: Secondary | ICD-10-CM | POA: Diagnosis not present

## 2020-12-04 DIAGNOSIS — R609 Edema, unspecified: Secondary | ICD-10-CM | POA: Diagnosis not present

## 2020-12-04 DIAGNOSIS — R0600 Dyspnea, unspecified: Secondary | ICD-10-CM | POA: Diagnosis not present

## 2020-12-04 DIAGNOSIS — R002 Palpitations: Secondary | ICD-10-CM | POA: Diagnosis not present

## 2020-12-29 DIAGNOSIS — R0602 Shortness of breath: Secondary | ICD-10-CM | POA: Diagnosis not present

## 2020-12-29 DIAGNOSIS — R0609 Other forms of dyspnea: Secondary | ICD-10-CM | POA: Diagnosis not present

## 2020-12-29 DIAGNOSIS — R002 Palpitations: Secondary | ICD-10-CM | POA: Diagnosis not present

## 2020-12-29 DIAGNOSIS — R609 Edema, unspecified: Secondary | ICD-10-CM | POA: Diagnosis not present

## 2020-12-29 DIAGNOSIS — F17200 Nicotine dependence, unspecified, uncomplicated: Secondary | ICD-10-CM | POA: Diagnosis not present

## 2020-12-29 DIAGNOSIS — R0789 Other chest pain: Secondary | ICD-10-CM | POA: Diagnosis not present

## 2021-01-01 DIAGNOSIS — F17200 Nicotine dependence, unspecified, uncomplicated: Secondary | ICD-10-CM | POA: Diagnosis not present

## 2021-01-01 DIAGNOSIS — I051 Rheumatic mitral insufficiency: Secondary | ICD-10-CM | POA: Diagnosis not present

## 2021-01-01 DIAGNOSIS — R0609 Other forms of dyspnea: Secondary | ICD-10-CM | POA: Diagnosis not present

## 2021-01-01 DIAGNOSIS — R609 Edema, unspecified: Secondary | ICD-10-CM | POA: Diagnosis not present

## 2021-01-01 DIAGNOSIS — R002 Palpitations: Secondary | ICD-10-CM | POA: Diagnosis not present

## 2021-01-21 DIAGNOSIS — R001 Bradycardia, unspecified: Secondary | ICD-10-CM | POA: Diagnosis not present

## 2021-01-21 DIAGNOSIS — I471 Supraventricular tachycardia: Secondary | ICD-10-CM | POA: Diagnosis not present

## 2021-02-05 DIAGNOSIS — R609 Edema, unspecified: Secondary | ICD-10-CM | POA: Diagnosis not present

## 2021-02-05 DIAGNOSIS — R0609 Other forms of dyspnea: Secondary | ICD-10-CM | POA: Diagnosis not present

## 2021-02-05 DIAGNOSIS — R0789 Other chest pain: Secondary | ICD-10-CM | POA: Diagnosis not present

## 2021-02-05 DIAGNOSIS — R002 Palpitations: Secondary | ICD-10-CM | POA: Diagnosis not present

## 2021-02-06 DIAGNOSIS — R634 Abnormal weight loss: Secondary | ICD-10-CM | POA: Diagnosis not present

## 2021-02-06 DIAGNOSIS — G894 Chronic pain syndrome: Secondary | ICD-10-CM | POA: Diagnosis not present

## 2021-02-06 DIAGNOSIS — J449 Chronic obstructive pulmonary disease, unspecified: Secondary | ICD-10-CM | POA: Diagnosis not present

## 2021-02-06 DIAGNOSIS — I10 Essential (primary) hypertension: Secondary | ICD-10-CM | POA: Diagnosis not present

## 2021-02-06 DIAGNOSIS — F418 Other specified anxiety disorders: Secondary | ICD-10-CM | POA: Diagnosis not present

## 2021-06-12 DIAGNOSIS — I10 Essential (primary) hypertension: Secondary | ICD-10-CM | POA: Diagnosis not present

## 2021-06-12 DIAGNOSIS — G894 Chronic pain syndrome: Secondary | ICD-10-CM | POA: Diagnosis not present

## 2021-06-12 DIAGNOSIS — J449 Chronic obstructive pulmonary disease, unspecified: Secondary | ICD-10-CM | POA: Diagnosis not present

## 2021-06-12 DIAGNOSIS — R634 Abnormal weight loss: Secondary | ICD-10-CM | POA: Diagnosis not present

## 2021-10-23 DIAGNOSIS — G894 Chronic pain syndrome: Secondary | ICD-10-CM | POA: Diagnosis not present

## 2021-10-23 DIAGNOSIS — J449 Chronic obstructive pulmonary disease, unspecified: Secondary | ICD-10-CM | POA: Diagnosis not present

## 2021-10-23 DIAGNOSIS — R634 Abnormal weight loss: Secondary | ICD-10-CM | POA: Diagnosis not present

## 2021-10-23 DIAGNOSIS — I10 Essential (primary) hypertension: Secondary | ICD-10-CM | POA: Diagnosis not present

## 2021-11-12 DIAGNOSIS — J060 Acute laryngopharyngitis: Secondary | ICD-10-CM | POA: Diagnosis not present

## 2021-11-16 DIAGNOSIS — J322 Chronic ethmoidal sinusitis: Secondary | ICD-10-CM | POA: Diagnosis not present

## 2021-11-22 DIAGNOSIS — J069 Acute upper respiratory infection, unspecified: Secondary | ICD-10-CM | POA: Diagnosis not present

## 2021-12-15 DIAGNOSIS — M545 Low back pain, unspecified: Secondary | ICD-10-CM | POA: Diagnosis not present

## 2022-02-17 DIAGNOSIS — J322 Chronic ethmoidal sinusitis: Secondary | ICD-10-CM | POA: Diagnosis not present

## 2022-03-02 IMAGING — US US PELVIS COMPLETE WITH TRANSVAGINAL
1 series · 13 of 25 positions shown · non-contrast
Comparison: 01/21/2016

CLINICAL DATA: Menorrhagia, bleeding for over a month, pain,
history of fibroids



[Series 1: us pelvic complete with transvaginal · 13 of 77 slices shown]
[im 1/77]
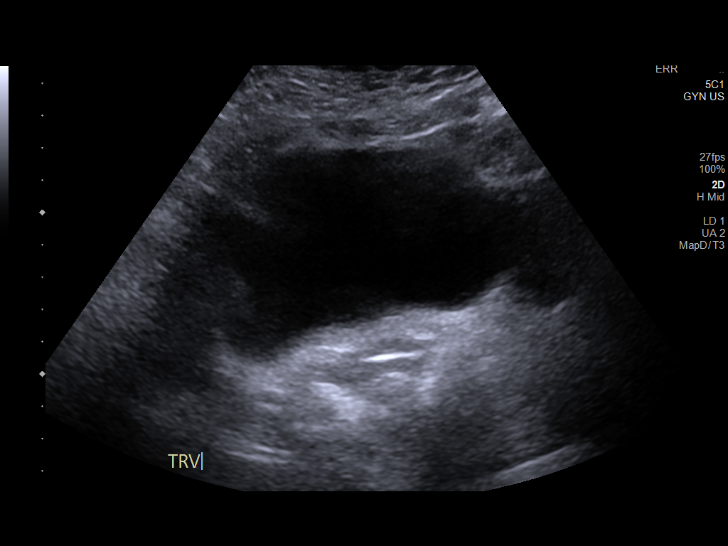
[im 7/77]
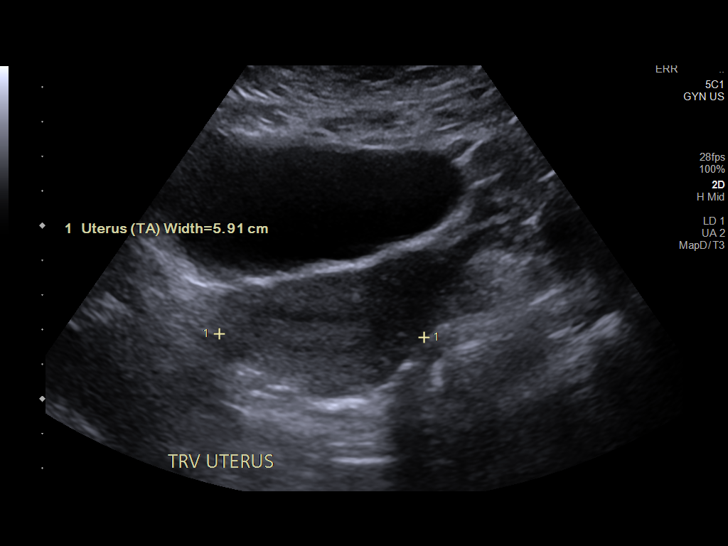
[im 13/77]
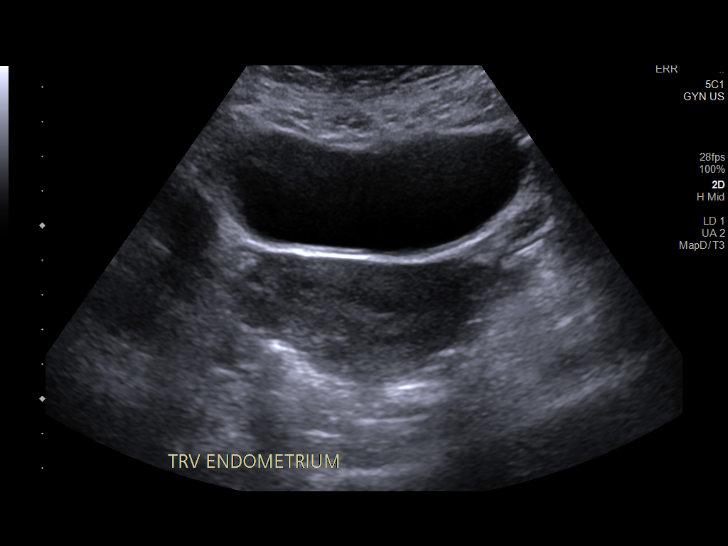
[im 20/77]
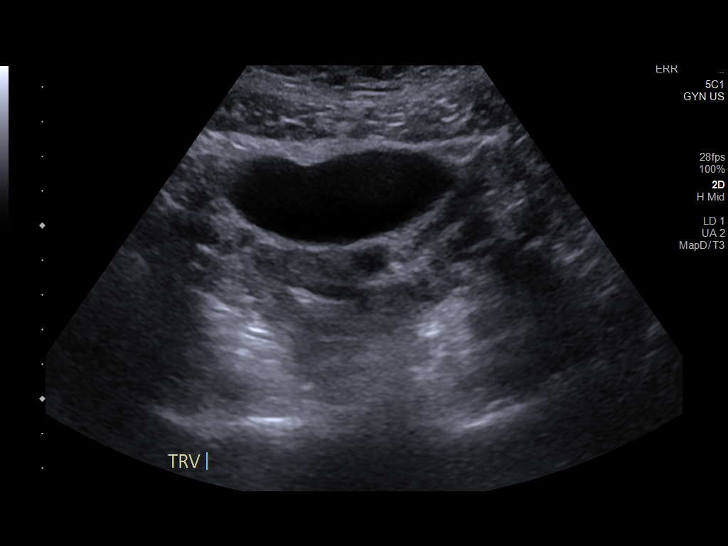
[im 26/77]
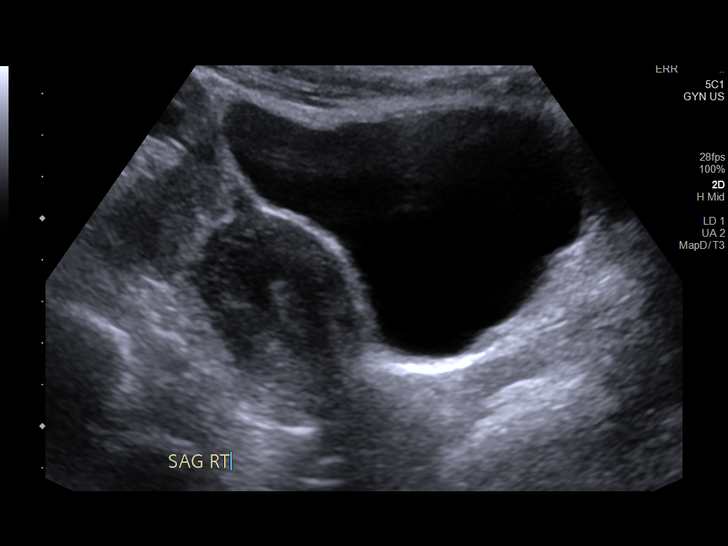
[im 32/77]
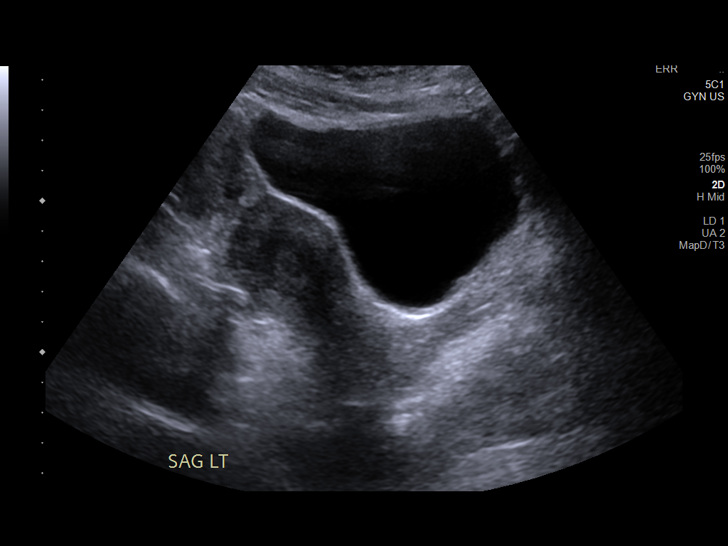
[im 39/77]
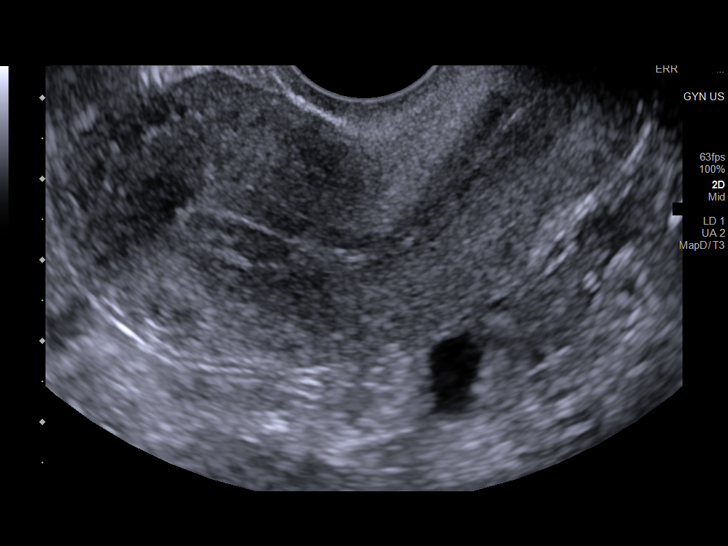
[im 45/77]
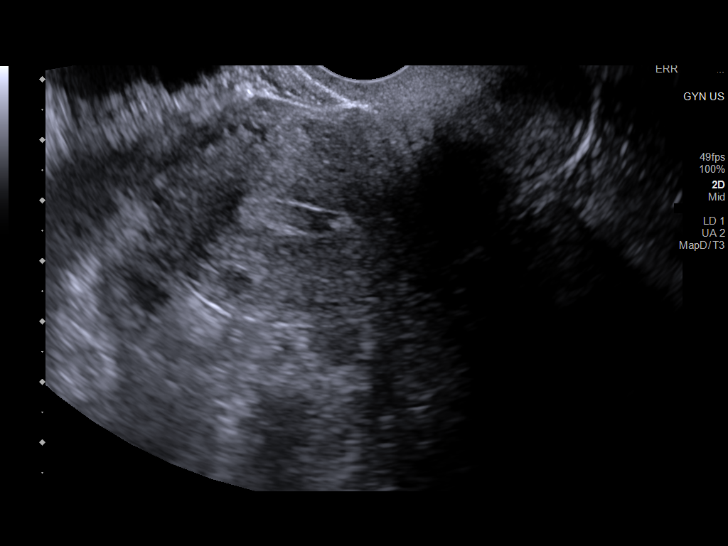
[im 51/77]
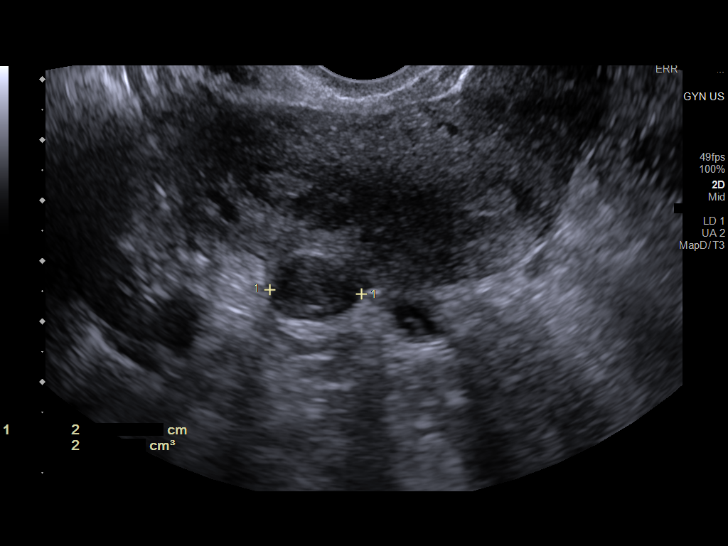
[im 58/77]
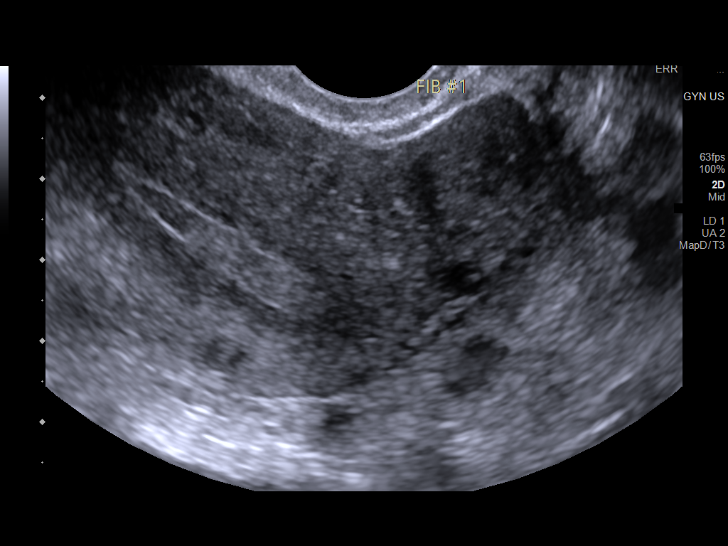
[im 64/77]
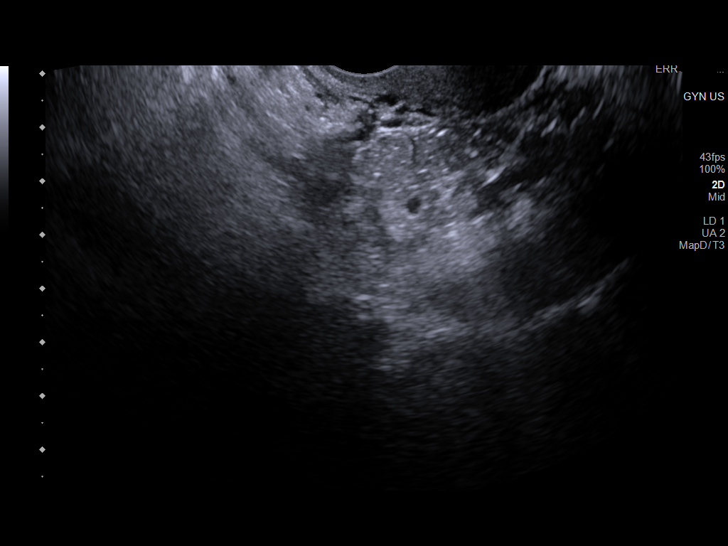
[im 70/77]
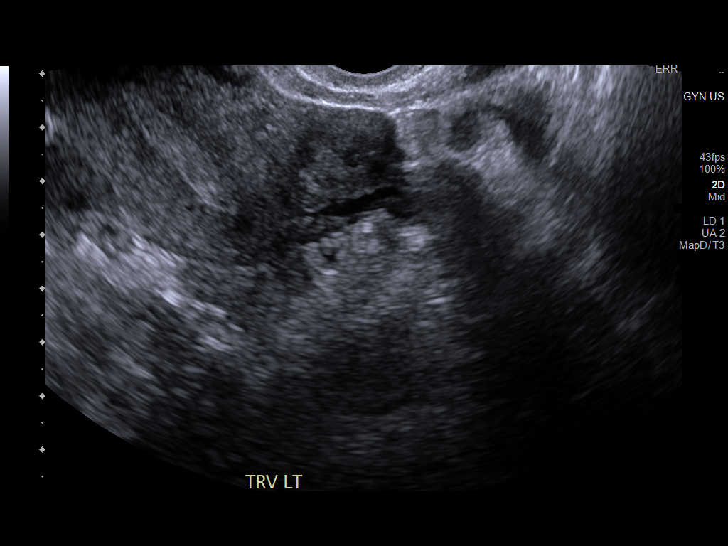
[im 77/77]
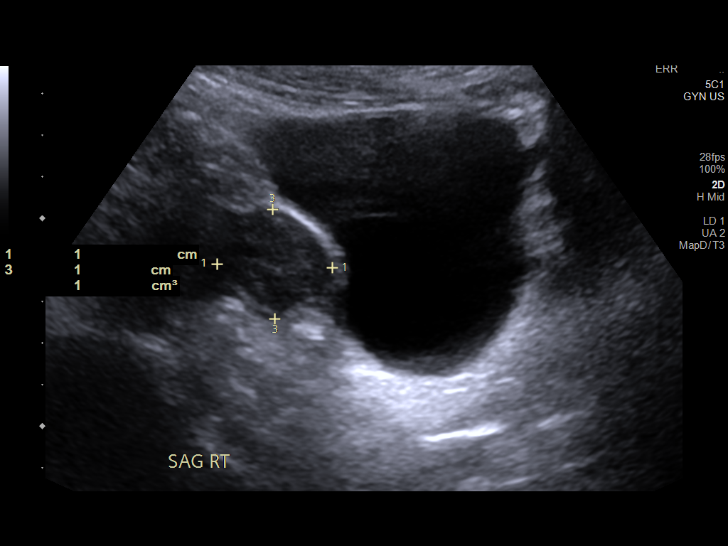

[13 of 25 positions shown; findings below may reference images not displayed]

FINDINGS: Uterus

Measurements: 8.8 x 3.5 x 5.9 cm = volume: 95 mL. Anteverted.
Heterogeneous myometrium. Small exophytic leiomyoma from posterior
uterus 19 x 11 x 15 mm. Additional anterior wall leiomyoma at fundus
2.8 x 2.6 x 2.5 cm, exophytic. No additional uterine masses.

Endometrium

Thickness: 5 mm. Question polyp at upper uterine segment 10 x 5 x 13
mm. No definite endometrial fluid.

Right ovary

Not visualized, likely obscured by bowel

Left ovary

Not visualized, likely obscured by bowel

Other findings

No free pelvic fluid.  No adnexal masses.
IMPRESSION: Question endometrial polyp at upper uterine segment 10 x 5 x 13 mm;
Consider further evaluation with sonohysterogram for confirmation
prior to hysteroscopy. Endometrial sampling should also be
considered if patient is at high risk for endometrial carcinoma.
(Ref: Radiological Reasoning: Algorithmic Workup of Abnormal Vaginal
Bleeding with Endovaginal Sonography and Sonohysterography. AJR
5553; 191:S68-73)

Two small exophytic uterine leiomyomata.

## 2022-03-29 DIAGNOSIS — J0110 Acute frontal sinusitis, unspecified: Secondary | ICD-10-CM | POA: Diagnosis not present

## 2022-03-29 DIAGNOSIS — R059 Cough, unspecified: Secondary | ICD-10-CM | POA: Diagnosis not present

## 2022-03-29 DIAGNOSIS — J029 Acute pharyngitis, unspecified: Secondary | ICD-10-CM | POA: Diagnosis not present

## 2022-04-14 ENCOUNTER — Encounter: Payer: Self-pay | Admitting: Internal Medicine

## 2022-04-14 ENCOUNTER — Ambulatory Visit: Payer: BC Managed Care – PPO | Admitting: Internal Medicine

## 2022-04-14 DIAGNOSIS — R109 Unspecified abdominal pain: Secondary | ICD-10-CM

## 2022-04-14 DIAGNOSIS — K625 Hemorrhage of anus and rectum: Secondary | ICD-10-CM

## 2022-04-14 DIAGNOSIS — K5900 Constipation, unspecified: Secondary | ICD-10-CM

## 2022-04-14 NOTE — Patient Instructions (Signed)
If you are age 54 or younger, your body mass index should be between 19-25. Your Body mass index is 25.5 kg/m. If this is out of the aformentioned range listed, please consider follow up with your Primary Care Provider.  ________________________________________________________  The [facility] [facility] providers would like to encourage you to use [facility] to communicate with providers for non-urgent requests or questions.  Due to long hold times on the telephone, sending your provider a message by [facility] may be a faster and more efficient way to get a response.  Please allow 48 business hours for a response.  Please remember that this is for non-urgent requests.  _______________________________________________________  Drink 8 cups of water a day and walk 30 minutes a day.  Please purchase the following medications over the counter and take as directed:  Fiber supplement such as Benefiber- use as directed daily Miralax: Take as  directed up to 3 times a day to achieve regular bowel movements  You have been scheduled for a colonoscopy. Please follow written instructions given to you at your visit today.  Please pick up your prep supplies at the pharmacy within the next 1-3 days. If you use inhalers (even only as needed), please bring them with you on the day of your procedure.  You have been scheduled for a CT scan of the abdomen and pelvis at [facility], 1st floor Radiology. You are scheduled on 04-22-22 at 5:00pm. You should arrive at 2:45pm time for registration and to start drinking contrast at 3pm and 4pm.    Please follow the written instructions below on the day of your exam:   1) Do not eat anything after 1pm (4 hours prior to your test)   You may take any medications as prescribed with a small amount of water, if necessary. If you take any of the following medications: METFORMIN, GLUCOPHAGE, GLUCOVANCE, AVANDAMET, RIOMET, FORTAMET, Conrad MET, JANUMET, GLUMETZA or METAGLIP, you MAY be  asked to HOLD this medication 48 hours AFTER the exam.   The purpose of you drinking the oral contrast is to aid in the visualization of your intestinal tract. The contrast solution may cause some diarrhea. Depending on your individual set of symptoms, you may also receive an intravenous injection of x-ray contrast/dye. Plan on being at [facility] for 45 minutes or longer, depending on the type of exam you are having performed.   If you have any questions regarding your exam or if you need to reschedule, you may call [facility] at [phone] between the hours of 8:00 am and 5:00 pm, Monday-Friday.   Due to recent changes in healthcare laws, you may see the results of your imaging and laboratory studies on MyChart before your provider has had a chance to review them.  We understand that in some cases there may be results that are confusing or concerning to you. Not all laboratory results come back in the same time frame and the provider may be waiting for multiple results in order to interpret others.  Please give [place] 48 hours in order for your provider to thoroughly review all the results before contacting the office for clarification of your results.   Thank you for entrusting me with your care and choosing [facility].  Dr Lorenso Courier

## 2022-04-14 NOTE — Progress Notes (Signed)
Chief Complaint: Abdominal pain  HPI : 52 year old female with history of COPD and HTN presents with abdominal pain  Her ab pain has worsened over the last month. The pain extends from the upper abdomen to the lower abdomen. The pain feels like indigestion and soreness. She feel bloated. Endorse nausea. Denies vomiting. She initially dealt with constipation due to being on opioids. However, despite being off of opioids for the last 5-6 months, she is still dealing with constipation. She has to take laxatives (2 tablets of bisacodyl) in order to cause a BM. She has had rectal bleeding. She does strain when having a BM. She on average has one BM once every 3-4 days. Denies dysphagia. If she gets bloated and eats certain foods, then she will have GERD. She had a Cologuard test done when she was 52 years old that was negative. Her mother had a colon blockage for unclear reasons. Mother did have colon polyps. Maternal aunt had colon cancer. Denies prior colonoscopy or EGD. She has had some weight loss but then gained back some of the weight.  Wt Readings from Last 3 Encounters:  04/14/22 158 lb (71.7 kg)  05/23/20 148 lb (67.1 kg)  05/12/20 146 lb (66.2 kg)   Past Medical History:  Diagnosis Date   Complication of anesthesia    caused bladder to stop working for a while after surgery.   COPD (chronic obstructive pulmonary disease) (HCC)    Hypertension    Mental disorder    Past Surgical History:  Procedure Laterality Date   CHOLECYSTECTOMY     DILATATION AND CURETTAGE/HYSTEROSCOPY WITH MINERVA N/A 05/14/2020   Procedure: DILATATION AND CURETTAGE/HYSTEROSCOPY WITH MINERVA;  Surgeon: Florian Buff, MD;  Location: [facility];  Service: Gynecology;  Laterality: N/A;   DILATION AND CURETTAGE OF UTERUS     TUBAL LIGATION     Family History  Problem Relation Age of Onset   Heart attack Father    Heart failure Mother    Drug abuse Brother        drug over dose   Heart attack Brother    Social  History   Tobacco Use   Smoking status: Every Day    Packs/day: 0.50    Types: Cigarettes   Smokeless tobacco: Never  Vaping Use   Vaping Use: Some days  Substance Use Topics   Alcohol use: Yes    Comment: occasionally    Drug use: No   Current Outpatient Medications  Medication Sig Dispense Refill   albuterol (PROVENTIL) (2.5 MG/3ML) 0.083% nebulizer solution Take 2.5 mg by nebulization every 6 (six) hours as needed for wheezing or shortness of breath.     albuterol (VENTOLIN HFA) 108 (90 Base) MCG/ACT inhaler Inhale 1-2 puffs into the lungs every 6 (six) hours as needed for wheezing or shortness of breath.      aspirin EC 81 MG tablet Take 81 mg by mouth daily.     Fluticasone-Umeclidin-Vilant (TRELEGY ELLIPTA) 100-62.5-25 MCG/INH AEPB Inhale 1 puff into the lungs daily.     Multiple Vitamin (MULTIVITAMIN) tablet Take 1 tablet by mouth daily.     ALPRAZolam (XANAX) 0.25 MG tablet Take 0.25 mg by mouth 2 (two) times daily as needed for anxiety. (Patient not taking: Reported on 04/14/2022)     cyanocobalamin 100 MCG tablet Take 100 mcg by mouth daily. (Patient not taking: Reported on 04/14/2022)     Ferrous Gluconate (IRON 27 PO) Take 1 tablet by mouth See admin instructions.  Takes daily during menstrual (Patient not taking: Reported on 04/14/2022)     ketorolac (TORADOL) 10 MG tablet Take 1 tablet (10 mg total) by mouth every 8 (eight) hours as needed. (Patient not taking: Reported on 04/14/2022) 15 tablet 0   ondansetron (ZOFRAN ODT) 8 MG disintegrating tablet Take 1 tablet (8 mg total) by mouth every 8 (eight) hours as needed for nausea or vomiting. (Patient not taking: Reported on 04/14/2022) 8 tablet 0   ondansetron (ZOFRAN-ODT) 4 MG disintegrating tablet Take 4 mg by mouth every 8 (eight) hours as needed for nausea or vomiting. (Patient not taking: Reported on 04/14/2022)     oxyCODONE-acetaminophen (PERCOCET/ROXICET) 5-325 MG tablet Take 1 tablet by mouth 2 (two) times daily as needed for  moderate pain or severe pain. (Patient not taking: Reported on 04/14/2022)     pantoprazole (PROTONIX) 40 MG tablet Take 40 mg by mouth 2 (two) times daily as needed (acid reflux). (Patient not taking: Reported on 04/14/2022)     No current facility-administered medications for this visit.   Allergies  Allergen Reactions   Elemental Sulfur Hives and Other (See Comments)    Flushing of skin, burning sensation, blisters   Benzonatate Other (See Comments)    Chest tightness   Clindamycin/Lincomycin Hives   Codeine Itching   Other     Anesthesia - bladder stopped working    Review of Systems: All systems reviewed and negative except where noted in HPI.   Physical Exam: BP (!) 140/78   Pulse 81   Ht '5\' 6"'$  (1.676 m)   Wt 158 lb (71.7 kg)   BMI 25.50 kg/m  Constitutional: Pleasant,well-developed, female in no acute distress. HEENT: Normocephalic and atraumatic. Conjunctivae are normal. No scleral icterus. Cardiovascular: Normal rate, regular rhythm.  Pulmonary/chest: Effort normal and breath sounds normal. No wheezing, rales or rhonchi. Abdominal: Soft, nondistended, mildly tender diffusely. Bowel sounds active throughout. There are no masses palpable. No hepatomegaly. Extremities: No edema Neurological: Alert and oriented to person place and time. Skin: Skin is warm and dry. No rashes noted. Psychiatric: Normal mood and affect. Behavior is normal.  Labs 04/2020: CBC and CMP nml. HIV NR  Pelvic U/S 03/28/20: IMPRESSION: Question endometrial polyp at upper uterine segment 10 x 5 x 13 mm; Consider further evaluation with sonohysterogram for confirmation prior to hysteroscopy. Endometrial sampling should also be considered if patient is at high risk for endometrial carcinoma. (Ref: Radiological Reasoning: Algorithmic Workup of Abnormal Vaginal Bleeding with Endovaginal Sonography and Sonohysterography. AJR 2008; 960:A54-09) Two small exophytic uterine leiomyomata.  ASSESSMENT AND  PLAN: Constipation Rectal bleeding Abdominal pain Nausea Weight loss Patient presents with abdominal pain over the last month, which may be related to constipation. She also describes some rectal bleeding and weight loss. Will start the patient on some constipation therapies and plan for further evaluation with CT and colonoscopy. - Drink 8 cups of water per day, walk 30 minutes per day, and take daily fiber supplement - Start daily Miralax. Titrate to achieve 1 BM per day. - CT A/P w/contrast - Colonoscopy [facility]  Christia Reading, MD

## 2022-04-22 ENCOUNTER — Ambulatory Visit (HOSPITAL_COMMUNITY)
Admission: RE | Admit: 2022-04-22 | Discharge: 2022-04-22 | Disposition: A | Discharge status: Home / Self Care | Payer: BC Managed Care – PPO | Source: Ambulatory Visit | Attending: Internal Medicine | Admitting: Internal Medicine

## 2022-04-22 DIAGNOSIS — K760 Fatty (change of) liver, not elsewhere classified: Secondary | ICD-10-CM | POA: Diagnosis not present

## 2022-04-22 DIAGNOSIS — R109 Unspecified abdominal pain: Secondary | ICD-10-CM | POA: Insufficient documentation

## 2022-04-22 DIAGNOSIS — K625 Hemorrhage of anus and rectum: Secondary | ICD-10-CM

## 2022-04-22 DIAGNOSIS — K5900 Constipation, unspecified: Secondary | ICD-10-CM | POA: Insufficient documentation

## 2022-04-22 DIAGNOSIS — I700 Atherosclerosis of aorta: Secondary | ICD-10-CM | POA: Diagnosis not present

## 2022-04-22 MED ORDER — IOHEXOL 300 MG/ML  SOLN
100.0000 mL | Freq: Once | INTRAMUSCULAR | Status: AC | PRN
  Administered 2022-04-22: 100 mL via INTRAVENOUS

## 2022-04-22 MED ORDER — IOHEXOL 9 MG/ML PO SOLN
ORAL | Status: AC
  Filled 2022-04-22: qty 1000

## 2022-04-23 ENCOUNTER — Telehealth: Payer: Self-pay | Admitting: Internal Medicine

## 2022-04-23 NOTE — Progress Notes (Signed)
Hi Robin Baldwin, please let the patient know that her CT scan shows stool accumulation, suggesting constipation. Please see if she can uptitrate her Miralax to BID until my next clinic appt with her. The test also showed that her bile duct was dilated, which may be due to her prior gallbladder removal. Since we don't have a recent set of liver tests though, I would like her to drop off a set of LFTs at her convenience so we can see if there is concern for biliary obstruction.

## 2022-04-26 ENCOUNTER — Encounter: Payer: Self-pay | Admitting: Internal Medicine

## 2022-04-26 ENCOUNTER — Other Ambulatory Visit: Payer: Self-pay

## 2022-04-26 DIAGNOSIS — K838 Other specified diseases of biliary tract: Secondary | ICD-10-CM

## 2022-04-26 NOTE — Telephone Encounter (Signed)
Called the patient with her results. See the CT imaging from 04/22/22.

## 2022-05-03 ENCOUNTER — Other Ambulatory Visit (INDEPENDENT_AMBULATORY_CARE_PROVIDER_SITE_OTHER): Payer: BC Managed Care – PPO

## 2022-05-03 ENCOUNTER — Encounter: Payer: Self-pay | Admitting: Internal Medicine

## 2022-05-03 ENCOUNTER — Ambulatory Visit (AMBULATORY_SURGERY_CENTER): Payer: BC Managed Care – PPO | Admitting: Internal Medicine

## 2022-05-03 VITALS — BP 126/80 | HR 64 | Temp 98.0°F | Resp 17 | Ht 66.0 in | Wt 158.0 lb

## 2022-05-03 DIAGNOSIS — K838 Other specified diseases of biliary tract: Secondary | ICD-10-CM | POA: Diagnosis not present

## 2022-05-03 DIAGNOSIS — D122 Benign neoplasm of ascending colon: Secondary | ICD-10-CM | POA: Diagnosis not present

## 2022-05-03 DIAGNOSIS — K5900 Constipation, unspecified: Secondary | ICD-10-CM

## 2022-05-03 LAB — HEPATIC FUNCTION PANEL
ALT: 27 U/L (ref 0–35)
AST: 32 U/L (ref 0–37)
Albumin: 4 g/dL (ref 3.5–5.2)
Alkaline Phosphatase: 65 U/L (ref 39–117)
Bilirubin, Direct: 0.1 mg/dL (ref 0.0–0.3)
Total Bilirubin: 0.6 mg/dL (ref 0.2–1.2)
Total Protein: 6.7 g/dL (ref 6.0–8.3)

## 2022-05-03 MED ORDER — SODIUM CHLORIDE 0.9 % IV SOLN: 500.0000 mL | Freq: Once | INTRAVENOUS | Status: DC

## 2022-05-03 NOTE — Progress Notes (Signed)
Called to room to assist during endoscopic procedure.  Patient ID and intended procedure confirmed with present staff. Received instructions for my participation in the procedure from the performing physician.  

## 2022-05-03 NOTE — Progress Notes (Signed)
VS by CW  Pt's states no medical or surgical changes since previsit or office visit.  

## 2022-05-03 NOTE — Progress Notes (Signed)
Vss nad trans to pacu °

## 2022-05-03 NOTE — Patient Instructions (Addendum)
Go to the Lab today for Liver function lab work. Resume previous diet, resume previous medications today. Office follow up Wednesday 06/30/22 arrive at 1:50 PM to 2nd floor.  Handouts given for polyps, diverticulosis and hemorrhoids.  YOU HAD AN ENDOSCOPIC PROCEDURE TODAY AT [facility]:   Refer to the procedure report that was given to you for any specific questions about what was found during the examination.  If the procedure report does not answer your questions, please call your gastroenterologist to clarify.  If you requested that your care partner not be given the details of your procedure findings, then the procedure report has been included in a sealed envelope for you to review at your convenience later.  YOU SHOULD EXPECT: Some feelings of bloating in the abdomen. Passage of more gas than usual.  Walking can help get rid of the air that was put into your GI tract during the procedure and reduce the bloating. If you had a lower endoscopy (such as a colonoscopy or flexible sigmoidoscopy) you may notice spotting of blood in your stool or on the toilet paper. If you underwent a bowel prep for your procedure, you may not have a normal bowel movement for a few days.  Please Note:  You might notice some irritation and congestion in your nose or some drainage.  This is from the oxygen used during your procedure.  There is no need for concern and it should clear up in a day or so.  SYMPTOMS TO REPORT IMMEDIATELY:  Following lower endoscopy (colonoscopy):  Excessive amounts of blood in the stool  Significant tenderness or worsening of abdominal pains  Swelling of the abdomen that is new, acute  Fever of 100F or higher  For urgent or emergent issues, a gastroenterologist can be reached at any hour by calling [phone]. Do not use MyChart messaging for urgent concerns.    DIET:  We do recommend a small meal at first, but then you may proceed to your regular diet.  Drink  plenty of fluids but you should avoid alcoholic beverages for 24 hours.  ACTIVITY:  You should plan to take it easy for the rest of today and you should NOT DRIVE or use heavy machinery until tomorrow (because of the sedation medicines used during the test).    FOLLOW UP: Our staff will call the number listed on your records the next business day following your procedure.  We will call around 7:15- 8:00 am to check on you and address any questions or concerns that you may have regarding the information given to you following your procedure. If we do not reach you, we will leave a message.     If any biopsies were taken you will be contacted by phone or by letter within the next 1-3 weeks.  Please call us at [phone] if you have not heard about the biopsies in 3 weeks.    SIGNATURES/CONFIDENTIALITY: You and/or your care partner have signed paperwork which will be entered into your electronic medical record.  These signatures attest to the fact that that the information above on your After Visit Summary has been reviewed and is understood.  Full responsibility of the confidentiality of this discharge information lies with you and/or your care-partner.

## 2022-05-03 NOTE — Op Note (Signed)
[facility] Patient Name: Robin Baldwin Procedure Date: 05/03/2022 7:15 AM MRN: [id number] Endoscopist: Georgian Co , , [id number] Age: 52 Referring MD:  Date of Birth: [birth year] Gender: Female Account #: [id number] Procedure:                Colonoscopy Indications:              Rectal bleeding Medicines:                Monitored Anesthesia Care Procedure:                Pre-Anesthesia Assessment:                           - Prior to the procedure, a History and Physical                            was performed, and patient medications and                            allergies were reviewed. The patient's tolerance of                            previous anesthesia was also reviewed. The risks                            and benefits of the procedure and the sedation                            options and risks were discussed with the patient.                            All questions were answered, and informed consent                            was obtained. Prior Anticoagulants: The patient has                            taken no anticoagulant or antiplatelet agents. ASA                            Grade Assessment: III - A patient with severe                            systemic disease. After reviewing the risks and                            benefits, the patient was deemed in satisfactory                            condition to undergo the procedure.                           After obtaining informed consent, the colonoscope  was passed under direct vision. Throughout the                            procedure, the patient's blood pressure, pulse, and                            oxygen saturations were monitored continuously. The                            CF [id number] was introduced through the anus                            and advanced to the the terminal ileum. The                            colonoscopy was performed without  difficulty. The                            patient tolerated the procedure well. The quality                            of the bowel preparation was good. The terminal                            ileum, ileocecal valve, appendiceal orifice, and                            rectum were photographed. Scope In: 7:49:12 AM Scope Out: 8:12:11 AM Scope Withdrawal Time: 0 hours 15 minutes 7 seconds  Total Procedure Duration: 0 hours 22 minutes 59 seconds  Findings:                 The terminal ileum appeared normal.                           Two sessile polyps were found in the ascending                            colon. The polyps were 8 to 12 mm in size. These                            polyps were removed with a cold snare. Resection                            and retrieval were complete.                           Multiple diverticula were found in the sigmoid                            colon.                           Non-bleeding internal hemorrhoids were found during  retroflexion. Complications:            No immediate complications. Estimated Blood Loss:     Estimated blood loss was minimal. Impression:               - The examined portion of the ileum was normal.                           - Two 8 to 12 mm polyps in the ascending colon,                            removed with a cold snare. Resected and retrieved.                           - Diverticulosis in the sigmoid colon.                           - Non-bleeding internal hemorrhoids. Recommendation:           - Discharge patient to home (with escort).                           - Await pathology results.                           - The findings and recommendations were discussed                            with the patient.                           - Return to GI clinic in 4 weeks. Dr Georgian Co "Lyndee Leo" Lorenso Courier,  05/03/2022 8:17:04 AM
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
## 2022-05-04 ENCOUNTER — Telehealth: Payer: Self-pay | Admitting: *Deleted

## 2022-05-04 NOTE — Telephone Encounter (Signed)
  Follow up Call-     05/03/2022    7:19 AM  Call back number  Post procedure Call Back phone  # [phone]  Permission to leave phone message Yes     Patient questions:  Do you have a fever, pain , or abdominal swelling? No. Pain Score  0 *  Have you tolerated food without any problems? Yes.    Have you been able to return to your normal activities? Yes.    Do you have any questions about your discharge instructions: Diet   No. Medications  No. Follow up visit  No.  Do you have questions or concerns about your Care? No.  Actions: * If pain score is 4 or above: No action needed, pain <4.

## 2022-05-05 ENCOUNTER — Ambulatory Visit: Payer: BC Managed Care – PPO | Admitting: Gastroenterology

## 2022-05-05 ENCOUNTER — Encounter: Payer: Self-pay | Admitting: Internal Medicine

## 2022-06-23 DIAGNOSIS — M65312 Trigger thumb, left thumb: Secondary | ICD-10-CM | POA: Diagnosis not present

## 2022-06-30 ENCOUNTER — Ambulatory Visit (INDEPENDENT_AMBULATORY_CARE_PROVIDER_SITE_OTHER): Payer: BC Managed Care – PPO | Admitting: Internal Medicine

## 2022-06-30 ENCOUNTER — Encounter: Payer: Self-pay | Admitting: Internal Medicine

## 2022-06-30 VITALS — BP 136/74 | HR 90 | Ht 66.0 in | Wt 163.0 lb

## 2022-06-30 DIAGNOSIS — K5900 Constipation, unspecified: Secondary | ICD-10-CM | POA: Diagnosis not present

## 2022-06-30 DIAGNOSIS — Z86010 Personal history of colonic polyps: Secondary | ICD-10-CM

## 2022-06-30 NOTE — Patient Instructions (Signed)
Glad you are feeling better follow up as needed.   If your blood pressure at your visit was 140/90 or greater, please contact your primary care physician to follow up on this.  _______________________________________________________  If you are age 54 or older, your body mass index should be between 23-30. Your Body mass index is 26.31 kg/m. If this is out of the aforementioned range listed, please consider follow up with your Primary Care Provider.  If you are age 65 or younger, your body mass index should be between 19-25. Your Body mass index is 26.31 kg/m. If this is out of the aformentioned range listed, please consider follow up with your Primary Care Provider.   ________________________________________________________  The [facility] providers would like to encourage you to use [facility] to communicate with providers for non-urgent requests or questions.  Due to long hold times on the telephone, sending your provider a message by [facility] may be a faster and more efficient way to get a response.  Please allow 48 business hours for a response.  Please remember that this is for non-urgent requests.  _______________________________________________________   Thank you for entrusting me with your care and for choosing [facility], Dr. Eulah Pont

## 2022-06-30 NOTE — Progress Notes (Addendum)
Chief Complaint: Abdominal pain, constipation  HPI : 52 year old female with history of COPD and HTN presents for follow up of abdominal pain and constipation  Interval History: Her bowel habits have gotten more regular. She has been better about her diet. She has been taking her fiber supplement and Miralax, which seems to be keeping her constipation under control. On average she will have anywhere between one stool every 2 days to two stools per day. Denies abdominal pain. Denies N&V. She is eating well. Weight has been stable.   Wt Readings from Last 3 Encounters:  06/30/22 163 lb (73.9 kg)  05/03/22 158 lb (71.7 kg)  04/14/22 158 lb (71.7 kg)   Current Outpatient Medications  Medication Sig Dispense Refill   aspirin EC 81 MG tablet Take 81 mg by mouth daily.     cyanocobalamin 100 MCG tablet Take 100 mcg by mouth daily.     Fluticasone-Umeclidin-Vilant (TRELEGY ELLIPTA) 100-62.5-25 MCG/INH AEPB Inhale 1 puff into the lungs daily.     Multiple Vitamin (MULTIVITAMIN) tablet Take 1 tablet by mouth daily.     albuterol (PROVENTIL) (2.5 MG/3ML) 0.083% nebulizer solution Take 2.5 mg by nebulization every 6 (six) hours as needed for wheezing or shortness of breath. (Patient not taking: Reported on 06/30/2022)     albuterol (VENTOLIN HFA) 108 (90 Base) MCG/ACT inhaler Inhale 1-2 puffs into the lungs every 6 (six) hours as needed for wheezing or shortness of breath.      ALPRAZolam (XANAX) 0.25 MG tablet Take 0.25 mg by mouth 2 (two) times daily as needed for anxiety. (Patient not taking: Reported on 04/14/2022)     Ferrous Gluconate (IRON 27 PO) Take 1 tablet by mouth See admin instructions. Takes daily during menstrual (Patient not taking: Reported on 04/14/2022)     ketorolac (TORADOL) 10 MG tablet Take 1 tablet (10 mg total) by mouth every 8 (eight) hours as needed. (Patient not taking: Reported on 04/14/2022) 15 tablet 0   methocarbamol (ROBAXIN) 750 MG tablet Take 750 mg by mouth every 4  (four) hours. (Patient not taking: Reported on 06/30/2022)     ondansetron (ZOFRAN ODT) 8 MG disintegrating tablet Take 1 tablet (8 mg total) by mouth every 8 (eight) hours as needed for nausea or vomiting. (Patient not taking: Reported on 04/14/2022) 8 tablet 0   ondansetron (ZOFRAN-ODT) 4 MG disintegrating tablet Take 4 mg by mouth every 8 (eight) hours as needed for nausea or vomiting. (Patient not taking: Reported on 04/14/2022)     oxyCODONE-acetaminophen (PERCOCET/ROXICET) 5-325 MG tablet Take 1 tablet by mouth 2 (two) times daily as needed for moderate pain or severe pain. (Patient not taking: Reported on 04/14/2022)     pantoprazole (PROTONIX) 40 MG tablet Take 40 mg by mouth 2 (two) times daily as needed (acid reflux). (Patient not taking: Reported on 04/14/2022)     predniSONE (STERAPRED UNI-PAK 21 TAB) 10 MG (21) TBPK tablet See admin instructions. follow package directions (Patient not taking: Reported on 06/30/2022)     No current facility-administered medications for this visit.   Physical Exam: BP 136/74   Pulse 90   Ht 5\' 6"  (1.676 m)   Wt 163 lb (73.9 kg)   BMI 26.31 kg/m  Constitutional: Pleasant,well-developed, female in no acute distress. HEENT: Normocephalic and atraumatic. Conjunctivae are normal. No scleral icterus. Cardiovascular: Normal rate, regular rhythm.  Pulmonary/chest: Effort normal and breath sounds normal. No wheezing, rales or rhonchi. Abdominal: Soft, nondistended, mildly tender diffusely. Bowel sounds active  throughout. There are no masses palpable. No hepatomegaly. Extremities: No edema Neurological: Alert and oriented to person place and time. Skin: Skin is warm and dry. No rashes noted. Psychiatric: Normal mood and affect. Behavior is normal.  Labs 04/2020: CBC and CMP nml. HIV NR  Labs 04/2022: LFTs are normal.  Pelvic U/S 03/28/20: IMPRESSION: Question endometrial polyp at upper uterine segment 10 x 5 x 13 mm; Consider further evaluation with  sonohysterogram for confirmation prior to hysteroscopy. Endometrial sampling should also be considered if patient is at high risk for endometrial carcinoma. (Ref: Radiological Reasoning: Algorithmic Workup of Abnormal Vaginal Bleeding with Endovaginal Sonography and Sonohysterography. AJR 2008; 335:L31-74) Two small exophytic uterine leiomyomata.  CT A/P w/contrast 04/22/22: IMPRESSION: 1. No acute abnormality in the abdomen or pelvis. 2. Moderate volume of formed stool in the colon. 3. Mild hepatomegaly with hepatic steatosis. 4. Dilation of the extrahepatic biliary tree with the common duct measuring 10 mm and tapering of the duct to the level of the ampulla. Findings are favored to reflect reservoir effect from prior cholecystectomy. However, suggest correlation with serum bilirubin levels to exclude the possibility of biliary obstruction. 5.  Aortic Atherosclerosis (ICD10-I70.0).  Colonoscopy 05/03/22:  Path: Surgical [P], colon, ascending, polyp (2) TUBULAR ADENOMA NEGATIVE FOR HIGH-GRADE DYSPLASIA AND CARCINOMA  ASSESSMENT AND PLAN: Constipation - Resolved History of colon polyps Patient was found to have constipation on her last CT scan. Patient has been diligent about changing her diet and taking her fiber supplements and Miralax, which has helped immensely with her constipation. She now no longer has any abdominal pain or nausea. Her recent colonoscopy in 04/2022 showed two tubular adenomas that were resected. Her last CT did show some mild CBD dilation, though this is favored to be due to her prior cholecystectomy because her LFTs are normal. - Continue drinking 8 cups of water per day, walking 30 minutes per day, and taking daily fiber supplement - Continue daily Miralax - Repeat colonoscopy in 04/2025 for polyp surveillance - RTC PRN  Eulah Pont, MD  I spent 30 minutes of time, including in depth chart review, independent review of results as outlined above, communicating  results with the patient directly, face-to-face time with the patient, coordinating care, and ordering studies and medications as appropriate, and documentation.

## 2022-07-09 ENCOUNTER — Ambulatory Visit: Payer: BC Managed Care – PPO | Admitting: Family Medicine

## 2022-07-09 ENCOUNTER — Encounter: Payer: Self-pay | Admitting: Family Medicine

## 2022-07-09 VITALS — BP 183/82 | HR 56 | Temp 98.3°F | Ht 66.0 in | Wt 161.0 lb

## 2022-07-09 DIAGNOSIS — Z136 Encounter for screening for cardiovascular disorders: Secondary | ICD-10-CM

## 2022-07-09 DIAGNOSIS — I700 Atherosclerosis of aorta: Secondary | ICD-10-CM | POA: Insufficient documentation

## 2022-07-09 DIAGNOSIS — F419 Anxiety disorder, unspecified: Secondary | ICD-10-CM | POA: Diagnosis not present

## 2022-07-09 DIAGNOSIS — K635 Polyp of colon: Secondary | ICD-10-CM

## 2022-07-09 DIAGNOSIS — Z1331 Encounter for screening for depression: Secondary | ICD-10-CM

## 2022-07-09 DIAGNOSIS — K760 Fatty (change of) liver, not elsewhere classified: Secondary | ICD-10-CM | POA: Insufficient documentation

## 2022-07-09 DIAGNOSIS — E559 Vitamin D deficiency, unspecified: Secondary | ICD-10-CM

## 2022-07-09 DIAGNOSIS — R053 Chronic cough: Secondary | ICD-10-CM

## 2022-07-09 DIAGNOSIS — R030 Elevated blood-pressure reading, without diagnosis of hypertension: Secondary | ICD-10-CM | POA: Diagnosis not present

## 2022-07-09 NOTE — Patient Instructions (Addendum)
Schedule an appointment with Triage RN to check BP monitor accuracy
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
## 2022-07-10 LAB — LIPID PANEL
Chol/HDL Ratio: 2.5 ratio (ref 0.0–4.4)
Cholesterol, Total: 213 mg/dL — ABNORMAL HIGH (ref 100–199)
HDL: 85 mg/dL (ref >39)
LDL Chol Calc (NIH): 119 mg/dL — ABNORMAL HIGH (ref 0–99)
Triglycerides: 50 mg/dL (ref 0–149)
VLDL Cholesterol Cal: 9 mg/dL (ref 5–40)

## 2022-07-10 LAB — CMP14+EGFR
ALT: 17 IU/L (ref 0–32)
AST: 23 IU/L (ref 0–40)
Albumin/Globulin Ratio: 1.8 (ref 1.2–2.2)
Albumin: 4.3 g/dL (ref 3.8–4.9)
Alkaline Phosphatase: 86 IU/L (ref 44–121)
BUN/Creatinine Ratio: 10 (ref 9–23)
BUN: 7 mg/dL (ref 6–24)
Bilirubin Total: 0.4 mg/dL (ref 0.0–1.2)
CO2: 25 mmol/L (ref 20–29)
Calcium: 9.3 mg/dL (ref 8.7–10.2)
Chloride: 104 mmol/L (ref 96–106)
Creatinine, Ser: 0.73 mg/dL (ref 0.57–1.00)
Globulin, Total: 2.4 g/dL (ref 1.5–4.5)
Glucose: 87 mg/dL (ref 70–99)
Potassium: 3.9 mmol/L (ref 3.5–5.2)
Sodium: 142 mmol/L (ref 134–144)
Total Protein: 6.7 g/dL (ref 6.0–8.5)
eGFR: 98 mL/min/{1.73_m2} (ref >59)

## 2022-07-10 LAB — CBC WITH DIFFERENTIAL/PLATELET
Basophils Absolute: 0.1 10*3/uL (ref 0.0–0.2)
Basos: 1 %
EOS (ABSOLUTE): 0.1 10*3/uL (ref 0.0–0.4)
Eos: 2 %
Hematocrit: 39.7 % (ref 34.0–46.6)
Hemoglobin: 13.6 g/dL (ref 11.1–15.9)
Immature Grans (Abs): 0 10*3/uL (ref 0.0–0.1)
Immature Granulocytes: 0 %
Lymphocytes Absolute: 2.7 10*3/uL (ref 0.7–3.1)
Lymphs: 47 %
MCH: 32.7 pg (ref 26.6–33.0)
MCHC: 34.3 g/dL (ref 31.5–35.7)
MCV: 95 fL (ref 79–97)
Monocytes Absolute: 0.6 10*3/uL (ref 0.1–0.9)
Monocytes: 10 %
Neutrophils Absolute: 2.3 10*3/uL (ref 1.4–7.0)
Neutrophils: 40 %
Platelets: 224 10*3/uL (ref 150–450)
RBC: 4.16 x10E6/uL (ref 3.77–5.28)
RDW: 11.3 % — ABNORMAL LOW (ref 11.7–15.4)
WBC: 5.7 10*3/uL (ref 3.4–10.8)

## 2022-07-10 LAB — TSH: TSH: 1.97 u[IU]/mL (ref 0.450–4.500)

## 2022-07-10 LAB — VITAMIN D 25 HYDROXY (VIT D DEFICIENCY, FRACTURES): Vit D, 25-Hydroxy: 42.7 ng/mL (ref 30.0–100.0)

## 2022-07-26 ENCOUNTER — Ambulatory Visit: Payer: BC Managed Care – PPO

## 2022-07-26 VITALS — BP 133/80 | HR 67

## 2022-07-26 DIAGNOSIS — Z0130 Encounter for examination of blood pressure without abnormal findings: Secondary | ICD-10-CM

## 2022-07-26 NOTE — Progress Notes (Signed)
Patient here today for blood pressure check Blood pressure 133/80, pulse 67

## 2022-08-03 ENCOUNTER — Encounter: Payer: Self-pay | Admitting: Family Medicine

## 2022-08-03 ENCOUNTER — Other Ambulatory Visit (INDEPENDENT_AMBULATORY_CARE_PROVIDER_SITE_OTHER): Payer: BC Managed Care – PPO

## 2022-08-03 ENCOUNTER — Ambulatory Visit: Payer: BC Managed Care – PPO | Admitting: Family Medicine

## 2022-08-03 VITALS — BP 136/84 | HR 67 | Temp 98.3°F | Ht 66.0 in | Wt 160.0 lb

## 2022-08-03 DIAGNOSIS — R42 Dizziness and giddiness: Secondary | ICD-10-CM

## 2022-08-03 DIAGNOSIS — F339 Major depressive disorder, recurrent, unspecified: Secondary | ICD-10-CM | POA: Diagnosis not present

## 2022-08-03 DIAGNOSIS — I700 Atherosclerosis of aorta: Secondary | ICD-10-CM

## 2022-08-03 DIAGNOSIS — F419 Anxiety disorder, unspecified: Secondary | ICD-10-CM | POA: Diagnosis not present

## 2022-08-03 NOTE — Progress Notes (Signed)
Acute Office Visit  Subjective:  Patient ID: Robin Baldwin, female    DOB: [birth year], 52 y.o.   MRN: [id number]  No chief complaint on file.  HPI Patient is in today for dizziness. Reports that yesterday at work she felt tired, exhausted and "making herself go"  She went to the nurse at work because she felt that she had episode at work where she had clouded vision all of sudden and had to hold onto the machine to keep from falling. States that her friends took her to the nurse at work. BP was 164/80. BG 164 at that time. Endorses headaches. Reports eye soreness and dark circles around her eyes.  States that she has a history of heart disease in her family and so she is nervous about her  Reports that she is having spells at night where she is feeling her heart pounding. She states she was previously evaluated by cardiology. She wore a monitor. She states that with the stress she is not able to sleep. She is up to the bathroom every 2 hours. Reports that she works a "weird schedule" She gets up at 2 in the morning, reports to work at [employer] am and works until 3:30pm.   ROS As per HPI   Objective:  BP 136/84   Pulse 67   Temp 98.3 F (36.8 C)   Ht 5\' 6"  (1.676 m)   Wt 160 lb (72.6 kg)   SpO2 99%   BMI 25.82 kg/m   Physical Exam       08/03/2022    2:40 PM 08/03/2022    2:39 PM 07/09/2022    4:25 PM  Depression screen PHQ 2/9  Decreased Interest  1 2  Down, Depressed, Hopeless  3 2  PHQ - 2 Score  4 4  Altered sleeping  3 2  Tired, decreased energy  3 3  Change in appetite  1 0  Feeling bad or failure about yourself   1 1  Trouble concentrating  1 0  Moving slowly or fidgety/restless  0 0  Suicidal thoughts  0 0  PHQ-9 Score  13 10  Difficult doing work/chores Somewhat difficult Somewhat difficult Somewhat difficult      08/03/2022    2:41 PM 07/09/2022    4:26 PM 03/24/2020   11:21 AM  GAD 7 : Generalized Anxiety Score  Nervous, Anxious, on Edge 2 0 1  Control/stop  worrying 3 3 3   Worry too much - different things 3 3 3   Trouble relaxing 2 3 3   Restless 1 0 0  Easily annoyed or irritable 3 3 2   Afraid - awful might happen 2 2 3   Total GAD 7 Score 16 14 15   Anxiety Difficulty Somewhat difficult Somewhat difficult    The 10-year ASCVD risk score (Arnett DK, et al., 2019) is: 3.2%   Values used to calculate the score:     Age: 46 years     Sex: Female     Is Non-Hispanic African American: No     Diabetic: No     Tobacco smoker: Yes     Systolic Blood Pressure: 136 mmHg     Is BP treated: No     HDL Cholesterol: 85 mg/dL     Total Cholesterol: 213 mg/dL  Assessment & Plan:  1. Dizziness EKG reviewed by myself today in office with no acute changes from previous. Patient completed a Holter January 01, 2021. No acute findings, did have ectopic beats at  that time. Will complete zio as below with 14 day monitor. Will repeat labs as below to evaluate for electrolyte abnormalities. Is established with Cardiology through [facility]. Will refer patient back to Cardiology for follow up.  - LONG TERM MONITOR (3-14 DAYS); Future - CMP14+EGFR - CBC with Differential/Platelet  2. Depression, recurrent (HCC) 3. Anxiety Pt screened positive for depression and anxiety today. Pt offered nonpharmacologic and pharmacologic therapy. Pt declined initiating treatment at this time. Safety contract established today with patient in clinic. Denies intent to harm herself or others. Pt to notify provider if they would like to initiate treatment. Would like to wait for results of Zio per patient.   4. Aortic atherosclerosis (HCC) Patient ASCVD risk score is in the low category. Does not require statin at this time. Though given her family history and history of smoking may want to consider starting it.   The above assessment and management plan was discussed with the patient. The patient verbalized understanding of and has agreed to the management plan using shared-decision  making. Patient is aware to call the clinic if they develop any new symptoms or if symptoms fail to improve or worsen. Patient is aware when to return to the clinic for a follow-up visit. Patient educated on when it is appropriate to go to the emergency department.   Return in about 6 weeks (around 09/14/2022) for Chronic Condition Follow up.  Neale Burly, DNP-FNP [facility] [address] [place], [place] [address] [phone]

## 2022-08-03 NOTE — Patient Instructions (Signed)
[employer].  

## 2022-08-04 LAB — CMP14+EGFR
ALT: 16 IU/L (ref 0–32)
AST: 24 IU/L (ref 0–40)
Albumin/Globulin Ratio: 1.5 (ref 1.2–2.2)
Albumin: 4.1 g/dL (ref 3.8–4.9)
Alkaline Phosphatase: 92 IU/L (ref 44–121)
BUN/Creatinine Ratio: 9 (ref 9–23)
BUN: 6 mg/dL (ref 6–24)
Bilirubin Total: 0.3 mg/dL (ref 0.0–1.2)
CO2: 24 mmol/L (ref 20–29)
Calcium: 9.2 mg/dL (ref 8.7–10.2)
Chloride: 101 mmol/L (ref 96–106)
Creatinine, Ser: 0.67 mg/dL (ref 0.57–1.00)
Globulin, Total: 2.7 g/dL (ref 1.5–4.5)
Glucose: 90 mg/dL (ref 70–99)
Potassium: 3.8 mmol/L (ref 3.5–5.2)
Sodium: 139 mmol/L (ref 134–144)
Total Protein: 6.8 g/dL (ref 6.0–8.5)
eGFR: 104 mL/min/{1.73_m2} (ref >59)

## 2022-08-04 LAB — CBC WITH DIFFERENTIAL/PLATELET
Basophils Absolute: 0 10*3/uL (ref 0.0–0.2)
Basos: 1 %
EOS (ABSOLUTE): 0.1 10*3/uL (ref 0.0–0.4)
Eos: 1 %
Hematocrit: 42.8 % (ref 34.0–46.6)
Hemoglobin: 14.3 g/dL (ref 11.1–15.9)
Immature Grans (Abs): 0 10*3/uL (ref 0.0–0.1)
Immature Granulocytes: 0 %
Lymphocytes Absolute: 2.5 10*3/uL (ref 0.7–3.1)
Lymphs: 37 %
MCH: 31.7 pg (ref 26.6–33.0)
MCHC: 33.4 g/dL (ref 31.5–35.7)
MCV: 95 fL (ref 79–97)
Monocytes Absolute: 0.5 10*3/uL (ref 0.1–0.9)
Monocytes: 8 %
Neutrophils Absolute: 3.5 10*3/uL (ref 1.4–7.0)
Neutrophils: 53 %
Platelets: 231 10*3/uL (ref 150–450)
RBC: 4.51 x10E6/uL (ref 3.77–5.28)
RDW: 11.1 % — ABNORMAL LOW (ref 11.7–15.4)
WBC: 6.7 10*3/uL (ref 3.4–10.8)

## 2022-08-05 ENCOUNTER — Emergency Department (HOSPITAL_COMMUNITY): Payer: BC Managed Care – PPO

## 2022-08-05 ENCOUNTER — Emergency Department (HOSPITAL_COMMUNITY)
Admission: EM | Admit: 2022-08-05 | Discharge: 2022-08-05 | Disposition: A | Discharge status: Home / Self Care | Payer: BC Managed Care – PPO | Attending: Emergency Medicine | Admitting: Emergency Medicine

## 2022-08-05 ENCOUNTER — Other Ambulatory Visit: Payer: Self-pay

## 2022-08-05 DIAGNOSIS — R5383 Other fatigue: Secondary | ICD-10-CM | POA: Diagnosis not present

## 2022-08-05 DIAGNOSIS — R079 Chest pain, unspecified: Secondary | ICD-10-CM | POA: Diagnosis not present

## 2022-08-05 DIAGNOSIS — R0789 Other chest pain: Secondary | ICD-10-CM | POA: Diagnosis not present

## 2022-08-05 DIAGNOSIS — Z7982 Long term (current) use of aspirin: Secondary | ICD-10-CM | POA: Insufficient documentation

## 2022-08-05 DIAGNOSIS — J449 Chronic obstructive pulmonary disease, unspecified: Secondary | ICD-10-CM | POA: Diagnosis not present

## 2022-08-05 DIAGNOSIS — Z7951 Long term (current) use of inhaled steroids: Secondary | ICD-10-CM | POA: Insufficient documentation

## 2022-08-05 DIAGNOSIS — I10 Essential (primary) hypertension: Secondary | ICD-10-CM | POA: Diagnosis not present

## 2022-08-05 LAB — BASIC METABOLIC PANEL
Anion gap: 10 (ref 5–15)
BUN: 10 mg/dL (ref 6–20)
CO2: 26 mmol/L (ref 22–32)
Calcium: 9.3 mg/dL (ref 8.9–10.3)
Chloride: 101 mmol/L (ref 98–111)
Creatinine, Ser: 0.62 mg/dL (ref 0.44–1.00)
GFR, Estimated: >60 mL/min (ref >60)
Glucose, Bld: 109 mg/dL — ABNORMAL HIGH (ref 70–99)
Potassium: 3.7 mmol/L (ref 3.5–5.1)
Sodium: 137 mmol/L (ref 135–145)

## 2022-08-05 LAB — D-DIMER, QUANTITATIVE: D-Dimer, Quant: 0.27 ug/mL-FEU (ref 0.00–0.50)

## 2022-08-05 LAB — CBC
HCT: 41.2 % (ref 36.0–46.0)
Hemoglobin: 14.3 g/dL (ref 12.0–15.0)
MCH: 32.3 pg (ref 26.0–34.0)
MCHC: 34.7 g/dL (ref 30.0–36.0)
MCV: 93 fL (ref 80.0–100.0)
Platelets: 220 10*3/uL (ref 150–400)
RBC: 4.43 MIL/uL (ref 3.87–5.11)
RDW: 10.9 % — ABNORMAL LOW (ref 11.5–15.5)
WBC: 5.6 10*3/uL (ref 4.0–10.5)
nRBC: 0 % (ref 0.0–0.2)

## 2022-08-05 LAB — TROPONIN I (HIGH SENSITIVITY)
Troponin I (High Sensitivity): 3 ng/L (ref <18)
Troponin I (High Sensitivity): 3 ng/L (ref <18)

## 2022-08-05 LAB — POC URINE PREG, ED: Preg Test, Ur: NEGATIVE

## 2022-08-05 MED ORDER — IBUPROFEN 400 MG PO TABS
600.0000 mg | ORAL_TABLET | Freq: Once | ORAL | Status: AC
  Administered 2022-08-05: 600 mg via ORAL
  Filled 2022-08-05: qty 2

## 2022-08-05 NOTE — ED Provider Notes (Signed)
[facility] EMERGENCY DEPARTMENT AT [facility] Provider Note   CSN: [id number] Arrival date & time: 08/05/22  1159     History  Chief Complaint  Patient presents with   Chest Pain   HPI Robin Baldwin is a 52 y.o. female with COPD and hypertension presenting for chest pain.  Started yesterday.  Located in the left lower chest.  It feels "achy".  It is nonradiating and nonreproducible and nonexertional.  Denies shortness of breath.  Denies cough congestion fever.  Denies calf tenderness, recent long trips, OCP use, and recent surgery.  Takes a daily baby aspirin.   Chest Pain      Home Medications Prior to Admission medications   Medication Sig Start Date End Date Taking? Authorizing Provider  albuterol (PROVENTIL) (2.5 MG/3ML) 0.083% nebulizer solution Take 2.5 mg by nebulization every 6 (six) hours as needed for wheezing or shortness of breath.    [provider]  albuterol (VENTOLIN HFA) 108 (90 Base) MCG/ACT inhaler Inhale 1-2 puffs into the lungs every 6 (six) hours as needed for wheezing or shortness of breath.  11/23/18   [provider]  aspirin EC 81 MG tablet Take 81 mg by mouth daily.    [provider]  BABY ASPIRIN PO Baby Aspirin    [provider]  cyanocobalamin 100 MCG tablet Take 100 mcg by mouth daily. Patient not taking: Reported on 07/09/2022    [provider]  Fluticasone-Umeclidin-Vilant (TRELEGY ELLIPTA) 100-62.5-25 MCG/INH AEPB Inhale 1 puff into the lungs daily.    [provider]  melatonin 5 MG TABS 1 tablet in the evening Orally Once a day    [provider]  Multiple Vitamin (MULTIVITAMIN) tablet Take 1 tablet by mouth daily.    [provider]  polyethylene glycol (MIRALAX / GLYCOLAX) 17 g packet Take 17 g by mouth 2 (two) times daily.    [provider]  Wheat Dextrin (BENEFIBER) POWD Take by mouth.    [provider]      Allergies    Elemental  sulfur, Gentamicin, Statins, Benzonatate, Clindamycin/lincomycin, Codeine, Other, and Sulfa antibiotics    Review of Systems   Review of Systems  Cardiovascular:  Positive for chest pain.    Physical Exam   Vitals:   08/05/22 1210 08/05/22 1346  BP: (!) 162/94 (!) 160/88  Pulse:  65  Resp: (!) 24 19  Temp:    SpO2:  100%    CONSTITUTIONAL:  well-appearing, NAD NEURO:  Alert and oriented x 3, CN 3-12 grossly intact EYES:  eyes equal and reactive ENT/NECK:  Supple, no stridor  CARDIO:  regular rate and rhythm, appears well-perfused  PULM:  No respiratory distress, CTAB GI/GU:  non-distended, soft MSK/SPINE:  No gross deformities, no edema, moves all extremities  SKIN:  no rash, atraumatic  *Additional and/or pertinent findings included in MDM below  ED Results / Procedures / Treatments   Labs (all labs ordered are listed, but only abnormal results are displayed) Labs Reviewed  BASIC METABOLIC PANEL - Abnormal; Notable for the following components:      Result Value   Glucose, Bld 109 (*)    All other components within normal limits  CBC - Abnormal; Notable for the following components:   RDW 10.9 (*)    All other components within normal limits  D-DIMER, QUANTITATIVE  POC URINE PREG, ED  TROPONIN I (HIGH SENSITIVITY)  TROPONIN I (HIGH SENSITIVITY)    EKG None  Radiology DG Chest  Port 1 View  Result Date: 08/05/2022 CLINICAL DATA:  Chest pain and fatigue since Monday. EXAM: PORTABLE CHEST 1 VIEW COMPARISON:  Two-view chest x-ray 12/18/2019 FINDINGS: The heart size is normal. Loop recorder is noted. The lungs are clear. The visualized soft tissues and bony thorax are unremarkable. IMPRESSION: No acute cardiopulmonary disease. Electronically Signed   By: Marin Roberts M.D.   On: 08/05/2022 13:01    Procedures Procedures    Medications Ordered in ED Medications  ibuprofen (ADVIL) tablet 600 mg (600 mg Oral Given 08/05/22 1404)    ED Course/ Medical  Decision Making/ A&P                             Medical Decision Making Amount and/or Complexity of Data Reviewed Labs: ordered. Radiology: ordered.  Risk Prescription drug management.   Initial Impression and Ddx 52 year old female is well-appearing presenting for chest pain.  Exam was unremarkable.  DDx includes ACS, PE, dissection, pneumonia, pneumothorax, and COPD exacerbation. Patient PMH that increases complexity of ED encounter:   COPD and hypertension   Interpretation of Diagnostics -I independent reviewed and interpreted the labs as followed: No acute derangements  - I independently visualized the following imaging with scope of interpretation limited to determining acute life threatening conditions related to emergency care: cxr, which revealed no acute cardiopulmonary disease  -I personally reviewed and interpreted EKG which revealed sinus rhythm  Patient Reassessment and Ultimate Disposition/Management Treat her pain with ibuprofen.  Fortunately ACS workup was reassuring.  Heart score was 2.  D-dimer was negative, patient was not initially tachycardic or hypoxic making PE unlikely.  Doubt aortic dissection given symmetric pulses.  COPD exacerbation also unlikely given lungs sound clear and patient is not hypoxic or in respiratory distress.  Advised patient to follow-up with her PCP.  Vital stable discharge.  Discussed return precautions.  Patient management required discussion with the following services or consulting groups:  None  Complexity of Problems Addressed Acute complicated illness or Injury  Additional Data Reviewed and Analyzed Further history obtained from: Past medical history and medications listed in the EMR and Prior ED visit notes  Patient Encounter Risk Assessment None         Final Clinical Impression(s) / ED Diagnoses Final diagnoses:  Chest pain, unspecified type    Rx / DC Orders ED Discharge Orders     None          Gareth Eagle, PA-C 08/05/22 1603    Bethann Berkshire, MD 08/07/22 2004

## 2022-08-05 NOTE — ED Notes (Signed)
Pt complains of headache now 4-10. C/P has not changed. HR noted to be in the low 50's

## 2022-08-05 NOTE — Discharge Instructions (Addendum)
Evaluation for your chest pain was overall reassuring.  EKG, x-ray and serial cardiac enzymes were within normal limits.  Recommend you follow-up with your PCP for ongoing chest pain.  If you have worsening chest pain, shortness of breath or calf tenderness or any other concerning symptom please return emerged part for further evaluation.

## 2022-08-05 NOTE — ED Triage Notes (Addendum)
Pt with CP and fatigue since Monday  Is wearing a heart monitor per PCP for bradycardia that she is to wear until the end of the month.  Pt has no n/v/d or shob.  Pt states she has also been keeping an eye on her BP and that has been doing well.  Pt does smoke 1.5 packs a day

## 2022-08-06 ENCOUNTER — Telehealth: Payer: Self-pay

## 2022-08-06 NOTE — Transitions of Care (Post Inpatient/ED Visit) (Signed)
   08/06/2022  Name: Robin Baldwin MRN: [id number] DOB: [birth year]  Today's TOC FU Call Status: Today's TOC FU Call Status:: Successful TOC FU Call Competed TOC FU Call Complete Date: 08/06/22  Transition Care Management Follow-up Telephone Call Date of Discharge: 08/05/22 [facility] Type of Discharge: Emergency Department Reason for ED Visit:  (chest pain) How have you been since you were released from the hospital?: Better Any questions or concerns?: No  Items Reviewed: Medications obtained,verified, and reconciled?: Yes (Medications Reviewed) Any new allergies since your discharge?: No Dietary orders reviewed?: Yes Do you have support at home?: No  Medications Reviewed Today: Medications Reviewed Today     Reviewed by Karena Addison, LPN (Licensed Practical Nurse) on 08/06/22 at [phone]  Med List Status: <None>   Medication Order Taking? Sig Documenting Provider Last Dose Status Informant  albuterol (PROVENTIL) (2.5 MG/3ML) 0.083% nebulizer solution [id number] Yes Take 2.5 mg by nebulization every 6 (six) hours as needed for wheezing or shortness of breath. [provider] Taking Active Self           Med Note (RATLIFF, THURSHELL   Wed Jun 30, 2022  1:46 PM) As needed  albuterol (VENTOLIN HFA) 108 (90 Base) MCG/ACT inhaler [id number] Yes Inhale 1-2 puffs into the lungs every 6 (six) hours as needed for wheezing or shortness of breath.  [provider] Taking Active Self  aspirin EC 81 MG tablet [id number] Yes Take 81 mg by mouth daily. [provider] Taking Active Self  BABY ASPIRIN PO [id number] Yes Baby Aspirin [provider] Taking Active   cyanocobalamin 100 MCG tablet [id number] No Take 100 mcg by mouth daily.  Patient not taking: Reported on 07/09/2022   [provider] Not Taking Active Self  Fluticasone-Umeclidin-Vilant (TRELEGY ELLIPTA) 100-62.5-25 MCG/INH AEPB [id number] Yes Inhale 1 puff into the lungs  daily. [provider] Taking Active Self  melatonin 5 MG TABS [id number] Yes 1 tablet in the evening Orally Once a day [provider] Taking Active   Multiple Vitamin (MULTIVITAMIN) tablet [id number] Yes Take 1 tablet by mouth daily. [provider] Taking Active Self  polyethylene glycol (MIRALAX / GLYCOLAX) 17 g packet [id number] Yes Take 17 g by mouth 2 (two) times daily. [provider] Taking Active   Wheat Dextrin [facility]) POWD [id number] Yes Take by mouth. [provider] Taking Active             Home Care and Equipment/Supplies: Were Home Health Services Ordered?: NA Any new equipment or medical supplies ordered?: NA  Functional Questionnaire: Do you need assistance with bathing/showering or dressing?: No Do you need assistance with meal preparation?: No Do you need assistance with eating?: No Do you have difficulty maintaining continence: No Do you need assistance with getting out of bed/getting out of a chair/moving?: No Do you have difficulty managing or taking your medications?: No  Follow up appointments reviewed: PCP Follow-up appointment confirmed?: NA Specialist Hospital Follow-up appointment confirmed?: Yes Date of Specialist follow-up appointment?: 08/19/22 Follow-Up Specialty Provider:: Cardio Do you need transportation to your follow-up appointment?: No Do you understand care options if your condition(s) worsen?: Yes-patient verbalized understanding    SIGNATURE Karena Addison, LPN [facility] Nurse Health Advisor Direct Dial [phone]
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
## 2022-08-17 ENCOUNTER — Ambulatory Visit: Payer: BC Managed Care – PPO | Admitting: Family Medicine

## 2022-08-20 ENCOUNTER — Encounter: Payer: Self-pay | Admitting: Family Medicine

## 2022-08-20 ENCOUNTER — Ambulatory Visit: Payer: BC Managed Care – PPO | Admitting: Family Medicine

## 2022-08-20 VITALS — BP 132/78 | HR 77 | Temp 98.0°F | Ht 66.0 in | Wt 163.8 lb

## 2022-08-20 DIAGNOSIS — Z0130 Encounter for examination of blood pressure without abnormal findings: Secondary | ICD-10-CM | POA: Diagnosis not present

## 2022-08-20 DIAGNOSIS — R42 Dizziness and giddiness: Secondary | ICD-10-CM | POA: Diagnosis not present

## 2022-08-20 DIAGNOSIS — R6889 Other general symptoms and signs: Secondary | ICD-10-CM | POA: Diagnosis not present

## 2022-08-20 DIAGNOSIS — E782 Mixed hyperlipidemia: Secondary | ICD-10-CM | POA: Diagnosis not present

## 2022-08-20 DIAGNOSIS — F419 Anxiety disorder, unspecified: Secondary | ICD-10-CM | POA: Diagnosis not present

## 2022-08-20 DIAGNOSIS — F339 Major depressive disorder, recurrent, unspecified: Secondary | ICD-10-CM

## 2022-08-20 NOTE — Progress Notes (Signed)
Acute Office Visit  Subjective:  Patient ID: Robin Baldwin, female    DOB: [birth year], 52 y.o.   MRN: [id number]  Chief Complaint  Patient presents with   Medical Management of Chronic Issues   HPI Patient is in today for follow up of BP and    Dizziness  Patient has been increasingly dizzy, fatigue. She has an appointment with Cardiology on Monday. She went to the ED on 08/05/22, 2 days after appt with [facility]. She states that she has been taking red yeast rice and stopped it 2 days ago. She believes that it has been causing her to feel this way. Reports that she does not feel 100% better, but 75% better. She She states that in the last two days she has not felt palpitations or her heart beating like it is coming out of her chest.   HTN  BP at home average 119/80. Highest at home is 150/91. Lowest has been 105/78.  ROS Denies fatigue, peripheral edema, changes to vision, chest pain, headaches, sweats, SOB, PND, orthopnea, neck pain  Endorses fatigue, anxiety, palpitations (has improved with discontinuation with red yeast rice).  Meds not on medication at this time.   CAD risks tobacco smoker  Hyperlipidemia: Patient presents with hyperlipidemia. chest pain, dyspnea, fatigue, and palpitations. There is a family history of hyperlipidemia. There  is unknown  a family history of early ischemia heart disease. Symptoms improving since stopping RYR  ROS As per HPI Objective:  BP 132/78   Pulse 77   Temp 98 F (36.7 C)   Ht 5\' 6"  (1.676 m)   Wt 163 lb 12.8 oz (74.3 kg)   SpO2 98%   BMI 26.44 kg/m   Physical Exam Constitutional:      General: She is awake. She is not in acute distress.    Appearance: Normal appearance. She is well-developed and well-groomed. She is not ill-appearing, toxic-appearing or diaphoretic.  Cardiovascular:     Rate and Rhythm: Normal rate.     Pulses: Normal pulses.          Radial pulses are 2+ on the right side and 2+ on the left side.       Posterior  tibial pulses are 2+ on the right side and 2+ on the left side.     Heart sounds: Normal heart sounds. No murmur heard.    No gallop.  Pulmonary:     Effort: Pulmonary effort is normal. No respiratory distress.     Breath sounds: Normal breath sounds. No stridor. No wheezing, rhonchi or rales.  Musculoskeletal:     Cervical back: Full passive range of motion without pain and neck supple.     Right lower leg: No edema.     Left lower leg: No edema.  Skin:    General: Skin is warm.     Capillary Refill: Capillary refill takes less than 2 seconds.  Neurological:     General: No focal deficit present.     Mental Status: She is alert, oriented to person, place, and time and easily aroused. Mental status is at baseline.     GCS: GCS eye subscore is 4. GCS verbal subscore is 5. GCS motor subscore is 6.     Motor: No weakness.  Psychiatric:        Attention and Perception: Attention and perception normal.        Mood and Affect: Mood and affect normal.        Speech: Speech normal.  Behavior: Behavior normal. Behavior is cooperative.        Thought Content: Thought content normal. Thought content does not include homicidal or suicidal ideation. Thought content does not include homicidal or suicidal plan.        Cognition and Memory: Cognition and memory normal.        Judgment: Judgment normal.       08/20/2022    1:06 PM 08/20/2022   12:59 PM 08/03/2022    2:40 PM  Depression screen PHQ 2/9  Decreased Interest 1 0   Down, Depressed, Hopeless 1 0   PHQ - 2 Score 2 0   Altered sleeping 3    Tired, decreased energy 2    Change in appetite 0    Feeling bad or failure about yourself  0    Trouble concentrating 0    Moving slowly or fidgety/restless 0    Suicidal thoughts 0    PHQ-9 Score 7    Difficult doing work/chores Somewhat difficult  Somewhat difficult      08/20/2022    1:06 PM 08/03/2022    2:41 PM 07/09/2022    4:26 PM 03/24/2020   11:21 AM  GAD 7 : Generalized Anxiety  Score  Nervous, Anxious, on Edge 0 2 0 1  Control/stop worrying 2 3 3 3   Worry too much - different things 2 3 3 3   Trouble relaxing 1 2 3 3   Restless 0 1 0 0  Easily annoyed or irritable 0 3 3 2   Afraid - awful might happen 3 2 2 3   Total GAD 7 Score 8 16 14 15   Anxiety Difficulty Somewhat difficult Somewhat difficult Somewhat difficult    Assessment & Plan:  1. Dizziness Improving. HR in normal range. Will await results of zio. Patient has follow up with Cardiology on Monday. Will await recommendations.  - Anemia Profile B  2. Blood pressure check Reviewed home BP log. Overall within normal range. Will not start antihypertensive at this time.  3. Mixed hyperlipidemia Patient cannot tolerate red yeast rice or statins. Provided written and verbal instructions on diet and lifestyle changes. Will follow up with repeat labs in 2 months. Will discuss repatha at that time.   4. Anxiety 5. Depression, recurrent (HCC) Denies SI. Has fear over her health and recent loss of family dog.   The above assessment and management plan was discussed with the patient. The patient verbalized understanding of and has agreed to the management plan using shared-decision making. Patient is aware to call the clinic if they develop any new symptoms or if symptoms fail to improve or worsen. Patient is aware when to return to the clinic for a follow-up visit. Patient educated on when it is appropriate to go to the emergency department.   Return in about 4 weeks (around 09/17/2022) for Chronic Condition Follow up.  Neale Burly, DNP-FNP [facility] [address] [place], [place] [address] [phone]

## 2022-08-20 NOTE — Patient Instructions (Signed)
Diet encouraged - increase intake of fresh fruits and vegetables, increase intake of lean proteins. Bake, broil, or grill foods. Avoid fried, greasy, and fatty foods. Avoid fast foods. Increase intake of fiber-rich whole grains. Exercise encouraged - at least 150 minutes per week and advance as tolerated.  

## 2022-08-21 LAB — ANEMIA PROFILE B
Basophils Absolute: 0 10*3/uL (ref 0.0–0.2)
Basos: 1 %
EOS (ABSOLUTE): 0.1 10*3/uL (ref 0.0–0.4)
Eos: 3 %
Ferritin: 99 ng/mL (ref 15–150)
Folate: >20 ng/mL (ref >3.0)
Hematocrit: 38.1 % (ref 34.0–46.6)
Hemoglobin: 13.2 g/dL (ref 11.1–15.9)
Immature Grans (Abs): 0 10*3/uL (ref 0.0–0.1)
Immature Granulocytes: 0 %
Iron Saturation: 29 % (ref 15–55)
Iron: 88 ug/dL (ref 27–159)
Lymphocytes Absolute: 2.3 10*3/uL (ref 0.7–3.1)
Lymphs: 56 %
MCH: 32.6 pg (ref 26.6–33.0)
MCHC: 34.6 g/dL (ref 31.5–35.7)
MCV: 94 fL (ref 79–97)
Monocytes Absolute: 0.7 10*3/uL (ref 0.1–0.9)
Monocytes: 16 %
Neutrophils Absolute: 1 10*3/uL — ABNORMAL LOW (ref 1.4–7.0)
Neutrophils: 24 %
Platelets: 192 10*3/uL (ref 150–450)
RBC: 4.05 x10E6/uL (ref 3.77–5.28)
RDW: 11.2 % — ABNORMAL LOW (ref 11.7–15.4)
Retic Ct Pct: 1.5 % (ref 0.6–2.6)
Total Iron Binding Capacity: 304 ug/dL (ref 250–450)
UIBC: 216 ug/dL (ref 131–425)
Vitamin B-12: 578 pg/mL (ref 232–1245)
WBC: 4.1 10*3/uL (ref 3.4–10.8)

## 2022-08-23 ENCOUNTER — Ambulatory Visit: Payer: BC Managed Care – PPO | Attending: Internal Medicine | Admitting: Cardiovascular Disease

## 2022-08-23 ENCOUNTER — Encounter: Payer: Self-pay | Admitting: Cardiovascular Disease

## 2022-08-23 ENCOUNTER — Encounter: Payer: Self-pay | Admitting: *Deleted

## 2022-08-23 VITALS — BP 144/84 | HR 78 | Ht 66.0 in | Wt 160.0 lb

## 2022-08-23 DIAGNOSIS — J432 Centrilobular emphysema: Secondary | ICD-10-CM | POA: Diagnosis not present

## 2022-08-23 DIAGNOSIS — R079 Chest pain, unspecified: Secondary | ICD-10-CM

## 2022-08-23 DIAGNOSIS — R002 Palpitations: Secondary | ICD-10-CM | POA: Diagnosis not present

## 2022-08-23 NOTE — Patient Instructions (Signed)
Medication Instructions:  Your physician recommends that you continue on your current medications as directed. Please refer to the Current Medication list given to you today.  *If you need a refill on your cardiac medications before your next appointment, please call your pharmacy*   Lab Work: NONE   If you have labs (blood work) drawn today and your tests are completely normal, you will receive your results only by: MyChart Message (if you have MyChart) OR A paper copy in the mail If you have any lab test that is abnormal or we need to change your treatment, we will call you to review the results.   Testing/Procedures: Your physician has requested that you have a lexiscan myoview. For further information please visit [email or web address] Please follow instruction sheet, as given.  Your physician has requested that you have an echocardiogram. Echocardiography is a painless test that uses sound waves to create images of your heart. It provides your doctor with information about the size and shape of your heart and how well your heart's chambers and valves are working. This procedure takes approximately one hour. There are no restrictions for this procedure. Please do NOT wear cologne, perfume, aftershave, or lotions (deodorant is allowed). Please arrive 15 minutes prior to your appointment time.    Follow-Up: At [facility], you and your health needs are our priority.  As part of our continuing mission to provide you with exceptional heart care, we have created designated Provider Care Teams.  These Care Teams include your primary Cardiologist (physician) and Advanced Practice Providers (APPs -  Physician Assistants and Nurse Practitioners) who all work together to provide you with the care you need, when you need it.  We recommend signing up for the patient portal called "MyChart".  Sign up information is provided on this After Visit Summary.  MyChart is used to connect with  patients for Virtual Visits (Telemedicine).  Patients are able to view lab/test results, encounter notes, upcoming appointments, etc.  Non-urgent messages can be sent to your provider as well.   To learn more about what you can do with MyChart, go to [email or web address].    Your next appointment:    As Needed   Provider:   Charlton Haws, MD    Other Instructions Thank you for choosing [facility]!

## 2022-08-25 MED ORDER — ATENOLOL 25 MG PO TABS: 12.5000 mg | ORAL_TABLET | Freq: Every day | ORAL | 0 refills | Status: AC

## 2022-08-25 NOTE — Progress Notes (Signed)
Recommend follow up with Cardiology, can start atenolol 12.5 mg while awaiting cardiology recommendations. Avoid stimulants such as caffeine. Increase hydration. Atenolol sent to [facility] in [place]

## 2022-08-25 NOTE — Addendum Note (Signed)
Addended by: Neale Burly on: 08/25/2022 04:59 PM   Modules accepted: Orders

## 2022-08-30 ENCOUNTER — Telehealth: Payer: Self-pay | Admitting: Cardiovascular Disease

## 2022-08-30 NOTE — Telephone Encounter (Signed)
Pt c/o medication issue:  1. Name of Medication: Atenolol 25 mg half daily   2. How are you currently taking this medication (dosage and times per day)? Not currently taking   3. Are you having a reaction (difficulty breathing--STAT)? No   4. What is your medication issue? Patient would like a call back to discuss if this medication prescribed from PCP will be okay to start. Please advise.

## 2022-08-30 NOTE — Telephone Encounter (Signed)
See monitor results, patient will take atenolol as directed by pcp.   Echo and lexiscan scheduled for July

## 2022-09-08 ENCOUNTER — Ambulatory Visit: Payer: BC Managed Care – PPO | Admitting: Internal Medicine

## 2022-09-19 NOTE — Progress Notes (Unsigned)
Robin Baldwin, female    DOB: [birth year]    MRN: [id number]   Brief patient profile:  35  yowf  active smoker  referred to pulmonary clinic in [place]  09/20/2022 by Robin Sia NP with Robin Baldwin  for ? Copd onset in her mid 64s originally rx symbicort then changed to Trelegy due to persistent flares of "bronchitis"      History of Present Illness  09/20/2022  Pulmonary/ 1st office eval/ Robin Baldwin / [place] [facility] maint on Trelegy  Chief Complaint  Patient presents with   Consult  Dyspnea:  onset in 51s / [employer] / uses  HC limited by back pain  Cough: esp in am x 20-30 min slt discolored  Sleep: flat bed/ 2 pillows no resp cc until am typically SABA use: has both hfa and neb  02: none LCS:  per [name]  No obvious day to day or daytime pattern/variability or assoc excess/ purulent sputum or mucus plugs or hemoptysis or cp or chest tightness, subjective wheeze or overt sinus or hb symptoms.     Also denies any obvious fluctuation of symptoms with weather or environmental changes or other aggravating or alleviating factors except as outlined above   No unusual exposure hx or h/o childhood pna/ asthma or knowledge of premature birth.  Current Allergies, Complete Past Medical History, Past Surgical History, Family History, and Social History were reviewed in [employer] record.  ROS  The following are not active complaints unless bolded Hoarseness, sore throat, dysphagia, dental problems, itching, sneezing,  nasal congestion or discharge of excess mucus or purulent secretions, ear ache,   fever, chills, sweats, unintended wt loss or wt gain, classically pleuritic or exertional cp,  orthopnea pnd or arm/hand swelling  or leg swelling, presyncope, palpitations, abdominal pain, anorexia, nausea, vomiting, diarrhea  or change in bowel habits or change in bladder habits, change in stools or change in urine, dysuria, hematuria,  rash, arthralgias, visual  complaints, headache, numbness, weakness or ataxia or problems with walking or coordination,  change in mood or  memory.              Outpatient Medications Prior to Visit  Medication Sig Dispense Refill   albuterol (PROVENTIL) (2.5 MG/3ML) 0.083% nebulizer solution Take 2.5 mg by nebulization every 6 (six) hours as needed for wheezing or shortness of breath.     albuterol (VENTOLIN HFA) 108 (90 Base) MCG/ACT inhaler Inhale 1-2 puffs into the lungs every 6 (six) hours as needed for wheezing or shortness of breath.      Ascorbic Acid (VITAMIN C PO) Take by mouth.     aspirin EC 81 MG tablet Take 81 mg by mouth daily.     atenolol (TENORMIN) 25 MG tablet Take 0.5 tablets (12.5 mg total) by mouth daily. 90 tablet 0   Fluticasone-Umeclidin-Vilant (TRELEGY ELLIPTA) 100-62.5-25 MCG/INH AEPB Inhale 1 puff into the lungs daily.     melatonin 5 MG TABS 1 tablet in the evening Orally Once a day     Multiple Vitamin (MULTIVITAMIN) tablet Take 1 tablet by mouth daily.     polyethylene glycol (MIRALAX / GLYCOLAX) 17 g packet Take 17 g by mouth 2 (two) times daily.     Wheat Dextrin (BENEFIBER) POWD Take by mouth.     No facility-administered medications prior to visit.    Past Medical History:  Diagnosis Date   Complication of anesthesia    caused bladder to stop working for a while after surgery.  COPD (chronic obstructive pulmonary disease) (HCC)    Hypertension    Mental disorder       Objective:     BP 137/82   Pulse 66   Ht 5\' 6"  (1.676 m)   Wt 165 lb 9.6 oz (75.1 kg)   SpO2 98%   BMI 26.73 kg/m   SpO2: 98 % RA   Pleasant amb wf nad   HEENT : Oropharynx  clear/ full dentures      NECK :  without  apparent JVD/ palpable Nodes/TM    LUNGS: no acc muscle use,  Min barrel  contour chest wall with bilateral  slightly decreased bs s audible wheeze and  without cough on insp or exp maneuvers and min  Hyperresonant  to  percussion bilaterally    CV:  RRR  no s3 or murmur or  increase in P2, and trace L >R ankle pitting edema   ABD:  soft and nontender with pos end  insp Hoover's  in the supine position.  No bruits or organomegaly appreciated   MS:  Nl gait/ ext warm without deformities Or obvious joint restrictions  calf tenderness, cyanosis or clubbing     SKIN: warm and dry without lesions    NEURO:  alert, approp, nl sensorium with  no motor or cerebellar deficits apparent.        I personally reviewed images and agree with radiology impression as follows:  CXR:   portable 08/05/22  No acute cardiopulmonary disease.  My review: mild hyperinflation     Assessment   COPD  GOLD ? Active smoking  - PFTs ordered   Group D (now reclassified as E) in terms of symptom/risk and laba/lama/ICS  therefore appropriate rx at this point >>> rec continue trelegy plus prn saba  Re SABA :  I spent extra time with pt today reviewing appropriate use of albuterol for prn use on exertion with the following points: 1) saba is for relief of sob that does not improve by walking a slower pace or resting but rather if the pt does not improve after trying this first. 2) If the pt is convinced, as many are, that saba helps recover from activity faster then it's easy to tell if this is the case by re-challenging : ie stop, take the inhaler, then p 5 minutes try the exact same activity (intensity of workload) that just caused the symptoms and see if they are substantially diminished or not after saba 3) if there is an activity that reproducibly causes the symptoms, try the saba 15 min before the activity on alternate days   If in fact the saba really does help, then fine to continue to use it prn but advised may need to look closer at the maintenance regimen being used to achieve better control of airways disease with exertion.           Each maintenance medication was reviewed in detail including emphasizing most importantly the difference between maintenance and prns and under  what circumstances the prns are to be triggered using an action plan format where appropriate.  Total time for H and P, chart review, counseling, reviewing hfa/dpi  device(s) and generating customized AVS unique to this office visit / same day charting  > 30 min new pt eval             Cigarette smoker 4-5 min discussion re active cigarette smoking in addition to office E&M  Ask about tobacco use:   ongoing  Advise quitting   I took an extended  opportunity with this patient to outline the consequences of continued cigarette use  in airway disorders based on all the data we have from the multiple national lung health studies (perfomed over decades at millions of dollars in cost)  indicating that smoking cessation, not choice of inhalers or pulmonary physicians, is the most important aspect of her  care.   Assess willingness:  Not fully  committed at this point Assist in quit attempt:  Per PCP when ready Arrange follow up:   Follow up per Primary Care planned        Sandrea Hughs, MD 09/20/2022

## 2022-09-20 ENCOUNTER — Encounter: Payer: Self-pay | Admitting: Internal Medicine

## 2022-09-20 ENCOUNTER — Ambulatory Visit: Payer: BC Managed Care – PPO | Admitting: Internal Medicine

## 2022-09-20 VITALS — BP 137/82 | HR 66 | Ht 66.0 in | Wt 165.6 lb

## 2022-09-20 DIAGNOSIS — F17210 Nicotine dependence, cigarettes, uncomplicated: Secondary | ICD-10-CM | POA: Diagnosis not present

## 2022-09-20 DIAGNOSIS — J449 Chronic obstructive pulmonary disease, unspecified: Secondary | ICD-10-CM

## 2022-09-20 NOTE — Patient Instructions (Incomplete)
Our records indicate that you are due for your screening mammogram.  Please call the imaging center that does your yearly mammograms to make an appointment for a mammogram at your earliest convenience. Our office also has a mobile unit through the [facility] that comes to our location. Please call our office if you would like to make an appointment.   

## 2022-09-20 NOTE — Patient Instructions (Addendum)
Plan A = Automatic = Always=    Trelegy one click each am   Plan B = Backup (to supplement plan A, not to replace it) Only use your albuterol inhaler as a rescue medication to be used if you can't catch your breath by resting or doing a relaxed purse lip breathing pattern.  - The less you use it, the better it will work when you need it. - Ok to use the inhaler up to 2 puffs  every 4 hours if you must but call for appointment if use goes up over your usual need - Don't leave home without it !!  (think of it like the spare tire for your car)   Plan C = Crisis (instead of Plan B but only if Plan B stops working) - only use your albuterol nebulizer if you first try Plan B and it fails to help > ok to use the nebulizer up to every 4 hours but if start needing it regularly call for immediate appointment   The key is to stop smoking completely before smoking completely stops you!   We will schedule you a PFT next available either [facility] or [facility] and I will call you the results    Please schedule a follow up visit in 4  months but call sooner if needed   - bring inhalers

## 2022-09-20 NOTE — Assessment & Plan Note (Signed)
Active smoking  - PFTs ordered   Group D (now reclassified as E) in terms of symptom/risk and laba/lama/ICS  therefore appropriate rx at this point >>> rec continue trelegy plus prn saba  Re SABA :  I spent extra time with pt today reviewing appropriate use of albuterol for prn use on exertion with the following points: 1) saba is for relief of sob that does not improve by walking a slower pace or resting but rather if the pt does not improve after trying this first. 2) If the pt is convinced, as many are, that saba helps recover from activity faster then it's easy to tell if this is the case by re-challenging : ie stop, take the inhaler, then p 5 minutes try the exact same activity (intensity of workload) that just caused the symptoms and see if they are substantially diminished or not after saba 3) if there is an activity that reproducibly causes the symptoms, try the saba 15 min before the activity on alternate days   If in fact the saba really does help, then fine to continue to use it prn but advised may need to look closer at the maintenance regimen being used to achieve better control of airways disease with exertion.           Each maintenance medication was reviewed in detail including emphasizing most importantly the difference between maintenance and prns and under what circumstances the prns are to be triggered using an action plan format where appropriate.  Total time for H and P, chart review, counseling, reviewing hfa/dpi  device(s) and generating customized AVS unique to this office visit / same day charting  > 30 min new pt eval

## 2022-09-20 NOTE — Assessment & Plan Note (Signed)
4-5 min discussion re active cigarette smoking in addition to office E&M  Ask about tobacco use:   ongoing Advise quitting   I took an extended  opportunity with this patient to outline the consequences of continued cigarette use  in airway disorders based on all the data we have from the multiple national lung health studies (perfomed over decades at millions of dollars in cost)  indicating that smoking cessation, not choice of inhalers or pulmonary physicians, is the most important aspect of her  care.   Assess willingness:  Not fully  committed at this point Assist in quit attempt:  Per PCP when ready Arrange follow up:   Follow up per Primary Care planned

## 2022-09-21 ENCOUNTER — Encounter: Payer: Self-pay | Admitting: Family Medicine

## 2022-09-21 ENCOUNTER — Ambulatory Visit: Payer: BC Managed Care – PPO | Admitting: Family Medicine

## 2022-09-21 VITALS — BP 137/72 | HR 59 | Temp 98.3°F | Ht 66.0 in | Wt 163.0 lb

## 2022-09-21 DIAGNOSIS — F419 Anxiety disorder, unspecified: Secondary | ICD-10-CM

## 2022-09-21 DIAGNOSIS — F339 Major depressive disorder, recurrent, unspecified: Secondary | ICD-10-CM | POA: Diagnosis not present

## 2022-09-21 DIAGNOSIS — F17210 Nicotine dependence, cigarettes, uncomplicated: Secondary | ICD-10-CM | POA: Diagnosis not present

## 2022-09-21 DIAGNOSIS — R42 Dizziness and giddiness: Secondary | ICD-10-CM

## 2022-09-21 DIAGNOSIS — R030 Elevated blood-pressure reading, without diagnosis of hypertension: Secondary | ICD-10-CM | POA: Diagnosis not present

## 2022-09-21 DIAGNOSIS — R5383 Other fatigue: Secondary | ICD-10-CM

## 2022-09-21 DIAGNOSIS — E782 Mixed hyperlipidemia: Secondary | ICD-10-CM

## 2022-09-21 NOTE — Progress Notes (Signed)
Acute Office Visit  Subjective:  Patient ID: Robin Baldwin, female    DOB: May 10, 1970, 52 y.o.   MRN: [id number]  Chief Complaint  Patient presents with   Medical Management of Chronic Issues    Copd fatigue   HPI Patient is in today for follow up on fatigue. States that she is tired from washing her walls in her house. She would like to exercise and walk, but she is fatigued throughout the day. She is trying to increase water intake throughout the day, but this causing her to urinate through the night every 2-3 hours. She was recently promoted to a team lead and hopes that it will help her physically. She has a stress test planned tomorrow.  Had follow up with Dr. Sherene Sires for pulmonology.   Depression/Anxiety  She is worried about her health. She is concerned about what it is. She feels that she is "not her spunky self". States that she is not going to let it get the best of her. She is relying on her faith as a coping mechanism.  She reports that she is "trying to deal with it"   Dizziness  States that she is having dizziness off and on. States that when she over exerts herself she gets dizzy. She is taking precautions such as increasing hydration and changing position slowly.   Tobacco Use  She is trying to reduce her intake. She is smoking 12 cigarettes per day. States that she has nicotine patches but does not feel that they work.   Hyperlipidemia  Is making dietary changes. States that she is using olive oil. She plans to start drinking pomegranate juice.   ROS As per HPI  Objective:  BP 137/72   Pulse (!) 59   Temp 98.3 F (36.8 C)   Ht 5\' 6"  (1.676 m)   Wt 163 lb (73.9 kg)   SpO2 100%   BMI 26.31 kg/m   Physical Exam Constitutional:      General: She is awake. She is not in acute distress.    Appearance: Normal appearance. She is well-developed, well-groomed and overweight. She is not ill-appearing, toxic-appearing or diaphoretic.  Cardiovascular:     Rate and  Rhythm: Regular rhythm. Bradycardia present.     Pulses: Normal pulses.          Radial pulses are 2+ on the right side and 2+ on the left side.       Posterior tibial pulses are 2+ on the right side and 2+ on the left side.     Heart sounds: Normal heart sounds. No murmur heard.    No gallop.     Comments: Trace edema Left leg Pulmonary:     Effort: Pulmonary effort is normal. No respiratory distress.     Breath sounds: Normal breath sounds. No stridor. No wheezing, rhonchi or rales.  Musculoskeletal:     Cervical back: Full passive range of motion without pain and neck supple.     Right lower leg: No edema.     Left lower leg: Edema present.  Skin:    General: Skin is warm.     Capillary Refill: Capillary refill takes less than 2 seconds.  Neurological:     General: No focal deficit present.     Mental Status: She is alert, oriented to person, place, and time and easily aroused. Mental status is at baseline.     GCS: GCS eye subscore is 4. GCS verbal subscore is 5. GCS motor subscore is  6.     Motor: No weakness.  Psychiatric:        Attention and Perception: Attention and perception normal.        Mood and Affect: Mood and affect normal.        Speech: Speech normal.        Behavior: Behavior normal. Behavior is cooperative.        Thought Content: Thought content normal. Thought content does not include homicidal or suicidal ideation. Thought content does not include homicidal or suicidal plan.        Cognition and Memory: Cognition and memory normal.        Judgment: Judgment normal.       09/21/2022    9:48 AM 08/20/2022    1:06 PM 08/20/2022   12:59 PM  Depression screen PHQ 2/9  Decreased Interest 3 1 0  Down, Depressed, Hopeless 2 1 0  PHQ - 2 Score 5 2 0  Altered sleeping 2 3   Tired, decreased energy 3 2   Change in appetite 2 0   Feeling bad or failure about yourself  1 0   Trouble concentrating 0 0   Moving slowly or fidgety/restless 0 0   Suicidal thoughts 0 0    PHQ-9 Score 13 7   Difficult doing work/chores Somewhat difficult Somewhat difficult       09/21/2022    9:49 AM 08/20/2022    1:06 PM 08/03/2022    2:41 PM 07/09/2022    4:26 PM  GAD 7 : Generalized Anxiety Score  Nervous, Anxious, on Edge 0 0 2 0  Control/stop worrying 3 2 3 3   Worry too much - different things 2 2 3 3   Trouble relaxing 1 1 2 3   Restless 0 0 1 0  Easily annoyed or irritable 3 0 3 3  Afraid - awful might happen 3 3 2 2   Total GAD 7 Score 12 8 16 14   Anxiety Difficulty Somewhat difficult Somewhat difficult Somewhat difficult Somewhat difficult   The 10-year ASCVD risk score (Arnett DK, et al., 2019) is: 3.3%   Values used to calculate the score:     Age: 23 years     Sex: Female     Is Non-Hispanic African American: No     Diabetic: No     Tobacco smoker: Yes     Systolic Blood Pressure: 137 mmHg     Is BP treated: No     HDL Cholesterol: 85 mg/dL     Total Cholesterol: 213 mg/dL  Assessment & Plan:  1. Cigarette smoker Praised patient for her desire to quit smoking. Discussed non pharmacological options for treatment. Will allow patient to trial management at home. Can consider chantix at follow up.   2. Anxiety Pt screened positive for anxiety today. Pt offered nonpharmacologic and pharmacologic therapy. Pt declined initiating treatment at this time. Safety contract established today with patient in clinic. Denies intent to harm herself or others. Pt to notify provider if they would like to initiate treatment.   3. Depression, recurrent (HCC) Pt screened positive for depression today. Pt offered nonpharmacologic and pharmacologic therapy. Pt declined initiating treatment at this time. Safety contract established today with patient in clinic. Denies intent to harm herself or others. Pt to notify provider if they would like to initiate treatment.   4. Fatigue, unspecified type Patient is following up with Cardiology tomorrow for stress test. Will trial  holding atenolol to determine if fatigue is due to  medication start. Patient to follow up if fatigue and dizziness worsen.   5. Elevated blood pressure reading Reviewed home BP measurements. Averaging within normal range. Will not start additional antihypertensives at this time. Instructed patient to hold atenolol.   6. Dizziness Patient is following up with Cardiology tomorrow for stress test. Will trial holding atenolol to determine if symptoms are due to medication start. Patient to follow up if fatigue and dizziness worsen. Patient to follow up sooner if dizziness and fatigue do not improve.   7. Mixed hyperlipidemia Patient intolerant of red yeast rice and statins. Reviewed ASCVD risk score. Will encourage patient to make diet and lifestyle changes.Can consider zetia at follow up depending on results of repeat lipids.   The above assessment and management plan was discussed with the patient. The patient verbalized understanding of and has agreed to the management plan using shared-decision making. Patient is aware to call the clinic if they develop any new symptoms or if symptoms fail to improve or worsen. Patient is aware when to return to the clinic for a follow-up visit. Patient educated on when it is appropriate to go to the emergency department.   Return in about 3 months (around 12/22/2022) for Chronic Condition Follow up.  Neale Burly, DNP-FNP [facility] [address] [place], [place] [address] [phone]

## 2022-09-22 ENCOUNTER — Encounter (HOSPITAL_COMMUNITY)
Admission: RE | Admit: 2022-09-22 | Discharge: 2022-09-22 | Disposition: A | Discharge status: Home / Self Care | Payer: BC Managed Care – PPO | Source: Ambulatory Visit | Attending: Cardiovascular Disease | Admitting: Cardiovascular Disease

## 2022-09-22 ENCOUNTER — Ambulatory Visit (HOSPITAL_COMMUNITY)
Admission: RE | Admit: 2022-09-22 | Discharge: 2022-09-22 | Disposition: A | Discharge status: Home / Self Care | Payer: BC Managed Care – PPO | Source: Ambulatory Visit | Attending: Cardiovascular Disease | Admitting: Cardiovascular Disease

## 2022-09-22 ENCOUNTER — Encounter (HOSPITAL_COMMUNITY): Payer: Self-pay

## 2022-09-22 DIAGNOSIS — R079 Chest pain, unspecified: Secondary | ICD-10-CM | POA: Diagnosis not present

## 2022-09-22 HISTORY — DX: Unspecified asthma, uncomplicated: J45.909

## 2022-09-22 LAB — NM MYOCAR MULTI W/SPECT W/WALL MOTION / EF
LV dias vol: 110 mL (ref 46–106)
LV sys vol: 36 mL
Nuc Stress EF: 67 %
Peak HR: 102 {beats}/min
RATE: 0.3
Rest HR: 72 {beats}/min
Rest Nuclear Isotope Dose: 10.8 mCi
SDS: 0
SRS: 1
SSS: 1
ST Depression (mm): 0 mm
Stress Nuclear Isotope Dose: 31 mCi
TID: 1.12

## 2022-09-22 MED ORDER — REGADENOSON 0.4 MG/5ML IV SOLN
INTRAVENOUS | Status: AC
  Administered 2022-09-22: 0.4 mg via INTRAVENOUS
  Filled 2022-09-22: qty 5

## 2022-09-22 MED ORDER — TECHNETIUM TC 99M TETROFOSMIN IV KIT
10.0000 | PACK | Freq: Once | INTRAVENOUS | Status: AC | PRN
  Administered 2022-09-22: 10.8 via INTRAVENOUS

## 2022-09-22 MED ORDER — TECHNETIUM TC 99M TETROFOSMIN IV KIT
30.0000 | PACK | Freq: Once | INTRAVENOUS | Status: AC | PRN
  Administered 2022-09-22: 31 via INTRAVENOUS

## 2022-09-22 MED ORDER — SODIUM CHLORIDE FLUSH 0.9 % IV SOLN
INTRAVENOUS | Status: AC
  Administered 2022-09-22: 10 mL via INTRAVENOUS
  Filled 2022-09-22: qty 10

## 2022-10-04 DIAGNOSIS — M79641 Pain in right hand: Secondary | ICD-10-CM | POA: Diagnosis not present

## 2022-10-06 ENCOUNTER — Ambulatory Visit (HOSPITAL_COMMUNITY)
Admission: RE | Admit: 2022-10-06 | Discharge: 2022-10-06 | Disposition: A | Discharge status: Home / Self Care | Payer: BC Managed Care – PPO | Source: Ambulatory Visit | Attending: Cardiovascular Disease | Admitting: Cardiovascular Disease

## 2022-10-06 DIAGNOSIS — R002 Palpitations: Secondary | ICD-10-CM | POA: Insufficient documentation

## 2022-10-06 DIAGNOSIS — I10 Essential (primary) hypertension: Secondary | ICD-10-CM | POA: Diagnosis not present

## 2022-10-06 LAB — ECHOCARDIOGRAM COMPLETE
Area-P 1/2: 3.91 cm2
MV M vel: 4.58 m/s
MV Peak grad: 83.7 mmHg
S' Lateral: 2.8 cm

## 2022-10-06 NOTE — Progress Notes (Signed)
  Echocardiogram 2D Echocardiogram has been performed.  [name] 10/06/2022, 11:20 AM

## 2022-10-13 ENCOUNTER — Telehealth: Payer: Self-pay | Admitting: Family Medicine

## 2022-10-13 DIAGNOSIS — Z0279 Encounter for issue of other medical certificate: Secondary | ICD-10-CM

## 2022-10-13 NOTE — Telephone Encounter (Signed)
PCP completed and signed FMLA forms. They have been faxed to [employer] at fax number [phone]. LMOVM informing patient they are complete and there is a copy up front.

## 2022-10-13 NOTE — Telephone Encounter (Signed)
Information completed and forwarded to PCP 

## 2022-10-25 DIAGNOSIS — M79641 Pain in right hand: Secondary | ICD-10-CM | POA: Diagnosis not present

## 2022-11-11 ENCOUNTER — Telehealth: Payer: Self-pay | Admitting: Family Medicine
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
## 2022-11-12 NOTE — Telephone Encounter (Signed)
Left detailed message per signed DPR. Encouraged call back if there are any questions.

## 2022-11-30 ENCOUNTER — Ambulatory Visit: Payer: BC Managed Care – PPO | Admitting: Family Medicine

## 2022-12-13 DIAGNOSIS — N912 Amenorrhea, unspecified: Secondary | ICD-10-CM | POA: Diagnosis not present

## 2022-12-13 DIAGNOSIS — R61 Generalized hyperhidrosis: Secondary | ICD-10-CM | POA: Diagnosis not present

## 2022-12-13 DIAGNOSIS — Z13220 Encounter for screening for lipoid disorders: Secondary | ICD-10-CM | POA: Diagnosis not present

## 2022-12-13 DIAGNOSIS — N921 Excessive and frequent menstruation with irregular cycle: Secondary | ICD-10-CM | POA: Diagnosis not present

## 2022-12-13 DIAGNOSIS — F419 Anxiety disorder, unspecified: Secondary | ICD-10-CM | POA: Diagnosis not present

## 2022-12-13 DIAGNOSIS — E538 Deficiency of other specified B group vitamins: Secondary | ICD-10-CM | POA: Diagnosis not present

## 2022-12-13 DIAGNOSIS — Z136 Encounter for screening for cardiovascular disorders: Secondary | ICD-10-CM | POA: Diagnosis not present

## 2022-12-13 DIAGNOSIS — E559 Vitamin D deficiency, unspecified: Secondary | ICD-10-CM | POA: Diagnosis not present

## 2022-12-13 DIAGNOSIS — Z716 Tobacco abuse counseling: Secondary | ICD-10-CM | POA: Diagnosis not present

## 2022-12-17 ENCOUNTER — Other Ambulatory Visit: Payer: Self-pay | Admitting: Family Medicine

## 2022-12-17 DIAGNOSIS — Z1231 Encounter for screening mammogram for malignant neoplasm of breast: Secondary | ICD-10-CM

## 2022-12-22 ENCOUNTER — Inpatient Hospital Stay
Admission: RE | Admit: 2022-12-22 | Discharge: 2022-12-22 | Discharge status: Home / Self Care | Payer: BC Managed Care – PPO | Source: Ambulatory Visit | Attending: Family Medicine | Admitting: Family Medicine

## 2022-12-22 DIAGNOSIS — Z1231 Encounter for screening mammogram for malignant neoplasm of breast: Secondary | ICD-10-CM

## 2022-12-28 ENCOUNTER — Ambulatory Visit: Payer: BC Managed Care – PPO | Admitting: Family Medicine

## 2023-01-31 DIAGNOSIS — Z1331 Encounter for screening for depression: Secondary | ICD-10-CM | POA: Diagnosis not present

## 2023-01-31 DIAGNOSIS — F419 Anxiety disorder, unspecified: Secondary | ICD-10-CM | POA: Diagnosis not present

## 2023-03-03 DIAGNOSIS — J020 Streptococcal pharyngitis: Secondary | ICD-10-CM | POA: Diagnosis not present

## 2023-03-03 DIAGNOSIS — R051 Acute cough: Secondary | ICD-10-CM | POA: Diagnosis not present

## 2023-03-18 DIAGNOSIS — Z03818 Encounter for observation for suspected exposure to other biological agents ruled out: Secondary | ICD-10-CM | POA: Diagnosis not present

## 2023-03-18 DIAGNOSIS — J029 Acute pharyngitis, unspecified: Secondary | ICD-10-CM | POA: Diagnosis not present

## 2023-03-18 DIAGNOSIS — R051 Acute cough: Secondary | ICD-10-CM | POA: Diagnosis not present

## 2023-03-28 DIAGNOSIS — R051 Acute cough: Secondary | ICD-10-CM | POA: Diagnosis not present

## 2023-05-02 DIAGNOSIS — F419 Anxiety disorder, unspecified: Secondary | ICD-10-CM | POA: Diagnosis not present

## 2023-05-02 DIAGNOSIS — Z1331 Encounter for screening for depression: Secondary | ICD-10-CM | POA: Diagnosis not present

## 2023-05-20 DIAGNOSIS — M79641 Pain in right hand: Secondary | ICD-10-CM | POA: Diagnosis not present

## 2023-08-01 DIAGNOSIS — Z131 Encounter for screening for diabetes mellitus: Secondary | ICD-10-CM | POA: Diagnosis not present

## 2023-08-01 DIAGNOSIS — Z136 Encounter for screening for cardiovascular disorders: Secondary | ICD-10-CM | POA: Diagnosis not present

## 2023-08-01 DIAGNOSIS — Z0000 Encounter for general adult medical examination without abnormal findings: Secondary | ICD-10-CM | POA: Diagnosis not present

## 2023-08-01 DIAGNOSIS — F419 Anxiety disorder, unspecified: Secondary | ICD-10-CM | POA: Diagnosis not present

## 2023-08-01 DIAGNOSIS — Z13220 Encounter for screening for lipoid disorders: Secondary | ICD-10-CM | POA: Diagnosis not present

## 2023-08-01 DIAGNOSIS — E559 Vitamin D deficiency, unspecified: Secondary | ICD-10-CM | POA: Diagnosis not present

## 2023-08-01 DIAGNOSIS — J449 Chronic obstructive pulmonary disease, unspecified: Secondary | ICD-10-CM | POA: Diagnosis not present

## 2023-08-01 DIAGNOSIS — E538 Deficiency of other specified B group vitamins: Secondary | ICD-10-CM | POA: Diagnosis not present

## 2023-08-01 DIAGNOSIS — J441 Chronic obstructive pulmonary disease with (acute) exacerbation: Secondary | ICD-10-CM | POA: Diagnosis not present

## 2023-08-04 DIAGNOSIS — J209 Acute bronchitis, unspecified: Secondary | ICD-10-CM | POA: Diagnosis not present

## 2023-12-02 ENCOUNTER — Other Ambulatory Visit: Payer: Self-pay | Admitting: Family Medicine

## 2023-12-02 DIAGNOSIS — Z1231 Encounter for screening mammogram for malignant neoplasm of breast: Secondary | ICD-10-CM

## 2023-12-23 DIAGNOSIS — F419 Anxiety disorder, unspecified: Secondary | ICD-10-CM | POA: Diagnosis not present

## 2023-12-23 DIAGNOSIS — I10 Essential (primary) hypertension: Secondary | ICD-10-CM | POA: Diagnosis not present

## 2023-12-23 DIAGNOSIS — R82998 Other abnormal findings in urine: Secondary | ICD-10-CM | POA: Diagnosis not present

## 2023-12-26 ENCOUNTER — Ambulatory Visit
Admission: RE | Admit: 2023-12-26 | Discharge: 2023-12-26 | Disposition: A | Payer: BC Managed Care – PPO | Source: Ambulatory Visit | Attending: Obstetrics and Gynecology | Admitting: Obstetrics and Gynecology

## 2023-12-26 DIAGNOSIS — Z1231 Encounter for screening mammogram for malignant neoplasm of breast: Secondary | ICD-10-CM

## 2023-12-27 DIAGNOSIS — Z7951 Long term (current) use of inhaled steroids: Secondary | ICD-10-CM | POA: Diagnosis not present

## 2023-12-27 DIAGNOSIS — I10 Essential (primary) hypertension: Secondary | ICD-10-CM | POA: Diagnosis not present

## 2023-12-27 DIAGNOSIS — I160 Hypertensive urgency: Secondary | ICD-10-CM | POA: Diagnosis not present

## 2023-12-27 DIAGNOSIS — Z1152 Encounter for screening for COVID-19: Secondary | ICD-10-CM | POA: Diagnosis not present

## 2023-12-27 DIAGNOSIS — Z882 Allergy status to sulfonamides status: Secondary | ICD-10-CM | POA: Diagnosis not present

## 2023-12-27 DIAGNOSIS — J441 Chronic obstructive pulmonary disease with (acute) exacerbation: Secondary | ICD-10-CM | POA: Diagnosis not present

## 2023-12-27 DIAGNOSIS — Z885 Allergy status to narcotic agent status: Secondary | ICD-10-CM | POA: Diagnosis not present

## 2023-12-27 DIAGNOSIS — R531 Weakness: Secondary | ICD-10-CM | POA: Diagnosis not present

## 2023-12-27 DIAGNOSIS — Z888 Allergy status to other drugs, medicaments and biological substances status: Secondary | ICD-10-CM | POA: Diagnosis not present

## 2023-12-27 DIAGNOSIS — F17210 Nicotine dependence, cigarettes, uncomplicated: Secondary | ICD-10-CM | POA: Diagnosis not present

## 2023-12-27 DIAGNOSIS — R0602 Shortness of breath: Secondary | ICD-10-CM | POA: Diagnosis not present

## 2023-12-27 DIAGNOSIS — R079 Chest pain, unspecified: Secondary | ICD-10-CM | POA: Diagnosis not present

## 2023-12-27 DIAGNOSIS — Z720 Tobacco use: Secondary | ICD-10-CM | POA: Diagnosis not present

## 2023-12-27 DIAGNOSIS — Z881 Allergy status to other antibiotic agents status: Secondary | ICD-10-CM | POA: Diagnosis not present

## 2023-12-30 ENCOUNTER — Other Ambulatory Visit: Payer: Self-pay | Admitting: Family Medicine

## 2023-12-30 DIAGNOSIS — R928 Other abnormal and inconclusive findings on diagnostic imaging of breast: Secondary | ICD-10-CM

## 2024-01-04 DIAGNOSIS — I10 Essential (primary) hypertension: Secondary | ICD-10-CM | POA: Diagnosis not present

## 2024-01-09 ENCOUNTER — Ambulatory Visit
Admission: RE | Admit: 2024-01-09 | Discharge: 2024-01-09 | Disposition: A | Discharge status: Home / Self Care | Source: Ambulatory Visit | Attending: Family Medicine | Admitting: Family Medicine

## 2024-01-09 ENCOUNTER — Ambulatory Visit

## 2024-01-09 DIAGNOSIS — R928 Other abnormal and inconclusive findings on diagnostic imaging of breast: Secondary | ICD-10-CM

## 2024-01-13 ENCOUNTER — Encounter

## 2024-01-13 ENCOUNTER — Other Ambulatory Visit

## 2024-01-20 DIAGNOSIS — J0100 Acute maxillary sinusitis, unspecified: Secondary | ICD-10-CM | POA: Diagnosis not present

## 2024-02-04 DIAGNOSIS — R0981 Nasal congestion: Secondary | ICD-10-CM | POA: Diagnosis not present

## 2024-02-04 DIAGNOSIS — J019 Acute sinusitis, unspecified: Secondary | ICD-10-CM | POA: Diagnosis not present

## 2024-02-17 DIAGNOSIS — R059 Cough, unspecified: Secondary | ICD-10-CM | POA: Diagnosis not present

## 2024-02-17 DIAGNOSIS — J019 Acute sinusitis, unspecified: Secondary | ICD-10-CM | POA: Diagnosis not present

## 2024-03-09 DIAGNOSIS — R600 Localized edema: Secondary | ICD-10-CM | POA: Diagnosis not present

## 2024-03-09 DIAGNOSIS — I10 Essential (primary) hypertension: Secondary | ICD-10-CM | POA: Diagnosis not present

## 2024-04-16 ENCOUNTER — Encounter: Payer: Self-pay | Admitting: Emergency Medicine

## 2024-04-16 ENCOUNTER — Ambulatory Visit
Admission: EM | Admit: 2024-04-16 | Discharge: 2024-04-16 | Disposition: A | Discharge status: Home / Self Care | Attending: Internal Medicine | Admitting: Internal Medicine

## 2024-04-16 DIAGNOSIS — R350 Frequency of micturition: Secondary | ICD-10-CM | POA: Insufficient documentation

## 2024-04-16 DIAGNOSIS — N390 Urinary tract infection, site not specified: Secondary | ICD-10-CM | POA: Diagnosis not present

## 2024-04-16 LAB — POCT URINE DIPSTICK
Bilirubin, UA: NEGATIVE
Glucose, UA: NEGATIVE mg/dL
Nitrite, UA: POSITIVE — AB
POC PROTEIN,UA: 30 — AB
Spec Grav, UA: 1.01
Urobilinogen, UA: 1 U/dL
pH, UA: 5

## 2024-04-16 MED ORDER — NITROFURANTOIN MONOHYD MACRO 100 MG PO CAPS: 100.0000 mg | ORAL_CAPSULE | Freq: Two times a day (BID) | ORAL | 0 refills | Status: AC

## 2024-04-16 NOTE — Discharge Instructions (Addendum)
 Urinalysis, physical exam findings and symptoms are consistent with a urinary tract infection.  We will treat this with antibiotics by mouth.  We recommend the following: Macrobid  100 mg twice daily for 5 days. This is an antibiotic.  Take this with food. Make sure to stay hydrated by drinking plenty of water. May alternate Tylenol  and ibuprofen  for pain Return to urgent care or PCP if symptoms worsen or fail to resolve.

## 2024-04-16 NOTE — ED Provider Notes (Signed)
 " [facility]    CSN: [id number] Arrival date & time: 04/16/24  1103      History   Chief Complaint Chief Complaint  Patient presents with   Dysuria    HPI Robin Baldwin is a 54 y.o. female.   54 year old female who presents urgent care with complaints of dysuria, urinary frequency, urinary urgency and nausea.  Her symptoms started last week.  She has been using Azo.  Her symptoms have not gotten better.  She denies any fevers, vomiting, diarrhea, back pain.   Dysuria Associated symptoms: nausea   Associated symptoms: no abdominal pain, no fever and no vomiting     Past Medical History:  Diagnosis Date   Asthma    Complication of anesthesia    caused bladder to stop working for a while after surgery.   COPD (chronic obstructive pulmonary disease) (HCC)    Hypertension    Mental disorder     Patient Active Problem List   Diagnosis Date Noted   Aortic atherosclerosis 07/09/2022   Hepatic steatosis 07/09/2022   DUB (dysfunctional uterine bleeding)    Anxiety 03/24/2020   Menorrhagia with irregular cycle 03/24/2020   Encounter for screening fecal occult blood testing 03/24/2020   Encounter for gynecological examination with Papanicolaou smear of cervix 03/24/2020   Uterine fibroid 01/30/2016   Endometrial polyp 01/30/2016   Cigarette smoker 01/30/2016   COPD  GOLD ? 01/30/2016   Menometrorrhagia 12/29/2015    Past Surgical History:  Procedure Laterality Date   CHOLECYSTECTOMY     DILATATION AND CURETTAGE/HYSTEROSCOPY WITH MINERVA N/A 05/14/2020   Procedure: DILATATION AND CURETTAGE/HYSTEROSCOPY WITH MINERVA;  Surgeon: Jayne Vonn DEL, MD;  Location: [facility];  Service: Gynecology;  Laterality: N/A;   DILATION AND CURETTAGE OF UTERUS     TUBAL LIGATION      OB History     Gravida  2   Para      Term      Preterm      AB  2   Living  0      SAB  2   IAB      Ectopic      Multiple      Live Births               Home  Medications    Prior to Admission medications  Medication Sig Start Date End Date Taking? Authorizing Provider  albuterol  (PROVENTIL ) (2.5 MG/3ML) 0.083% nebulizer solution Take 2.5 mg by nebulization every 6 (six) hours as needed for wheezing or shortness of breath.   Yes [provider]  albuterol  (VENTOLIN  HFA) 108 (90 Base) MCG/ACT inhaler Inhale 1-2 puffs into the lungs every 6 (six) hours as needed for wheezing or shortness of breath.  11/23/18  Yes [provider]  Ascorbic Acid (VITAMIN C PO) Take by mouth.   Yes [provider]  aspirin  EC 81 MG tablet Take 81 mg by mouth daily.   Yes [provider]  atenolol  (TENORMIN ) 25 MG tablet Take 0.5 tablets (12.5 mg total) by mouth daily. 08/25/22  Yes Milian, Marry Lenis, FNP  Fluticasone-Umeclidin-Vilant (TRELEGY ELLIPTA) 100-62.5-25 MCG/INH AEPB Inhale 1 puff into the lungs daily.   Yes [provider]  melatonin 5 MG TABS 1 tablet in the evening Orally Once a day   Yes [provider]  Multiple Vitamin (MULTIVITAMIN) tablet Take 1 tablet by mouth daily.   Yes [provider]  nitrofurantoin , macrocrystal-monohydrate, (MACROBID ) 100 MG capsule Take  1 capsule (100 mg total) by mouth 2 (two) times daily. 04/16/24  Yes Breyona Swander A, PA-C  polyethylene glycol (MIRALAX / GLYCOLAX) 17 g packet Take 17 g by mouth 2 (two) times daily.   Yes [provider]  Wheat Dextrin (BENEFIBER) POWD Take by mouth.   Yes [provider]    Family History Family History  Problem Relation Age of Onset   Heart failure Mother    Heart attack Father    Colon cancer Maternal Aunt    Drug abuse Brother        drug over dose   Heart attack Brother    Esophageal cancer Neg Hx    Stomach cancer Neg Hx    Rectal cancer Neg Hx    Breast cancer Neg Hx     Social History Social History[1]   Allergies   Elemental sulfur, Gentamicin, Red yeast rice [monascus purpureus went  yeast], Statins, Benzonatate, Clindamycin/lincomycin, Codeine, Other, and Sulfa antibiotics   Review of Systems Review of Systems  Constitutional:  Negative for chills and fever.  HENT:  Negative for ear pain and sore throat.   Eyes:  Negative for pain and visual disturbance.  Respiratory:  Negative for cough and shortness of breath.   Cardiovascular:  Negative for chest pain and palpitations.  Gastrointestinal:  Positive for nausea. Negative for abdominal pain and vomiting.  Genitourinary:  Positive for dysuria, frequency and urgency. Negative for hematuria.  Musculoskeletal:  Negative for arthralgias and back pain.  Skin:  Negative for color change and rash.  Neurological:  Negative for seizures and syncope.  All other systems reviewed and are negative.    Physical Exam Triage Vital Signs ED Triage Vitals  Encounter Vitals Group     BP 04/16/24 1120 137/88     Girls Systolic BP Percentile --      Girls Diastolic BP Percentile --      Boys Systolic BP Percentile --      Boys Diastolic BP Percentile --      Pulse Rate 04/16/24 1120 67     Resp 04/16/24 1120 18     Temp 04/16/24 1120 98.3 F (36.8 C)     Temp Source 04/16/24 1120 Oral     SpO2 04/16/24 1120 96 %     Weight 04/16/24 1119 170 lb (77.1 kg)     Height 04/16/24 1119 5' 6 (1.676 m)     Head Circumference --      Peak Flow --      Pain Score 04/16/24 1118 7     Pain Loc --      Pain Education --      Exclude from Growth Chart --    No data found.  Updated Vital Signs BP 137/88 (BP Location: Right Arm)   Pulse 67   Temp 98.3 F (36.8 C) (Oral)   Resp 18   Ht 5' 6 (1.676 m)   Wt 170 lb (77.1 kg)   SpO2 96%   BMI 27.44 kg/m   Visual Acuity Right Eye Distance:   Left Eye Distance:   Bilateral Distance:    Right Eye Near:   Left Eye Near:    Bilateral Near:     Physical Exam Vitals and nursing note reviewed.  Constitutional:      General: She is not in acute distress.    Appearance: She is  well-developed.  HENT:     Head: Normocephalic and atraumatic.  Eyes:     Conjunctiva/sclera:  Conjunctivae normal.  Cardiovascular:     Rate and Rhythm: Normal rate and regular rhythm.     Heart sounds: No murmur heard. Pulmonary:     Effort: Pulmonary effort is normal. No respiratory distress.     Breath sounds: Normal breath sounds.  Abdominal:     Palpations: Abdomen is soft.     Tenderness: There is no abdominal tenderness.  Musculoskeletal:        General: No swelling.     Cervical back: Neck supple.  Skin:    General: Skin is warm and dry.     Capillary Refill: Capillary refill takes less than 2 seconds.  Neurological:     Mental Status: She is alert.  Psychiatric:        Mood and Affect: Mood normal.      UC Treatments / Results  Labs (all labs ordered are listed, but only abnormal results are displayed) Labs Reviewed  POCT URINE DIPSTICK - Abnormal; Notable for the following components:      Result Value   Color, UA orange (*)    Clarity, UA cloudy (*)    Ketones, POC UA small (15) (*)    Blood, UA trace-intact (*)    POC PROTEIN,UA =30 (*)    Nitrite, UA Positive (*)    Leukocytes, UA Large (3+) (*)    All other components within normal limits  URINE CULTURE    EKG   Radiology No results found.  Procedures Procedures (including critical care time)  Medications Ordered in UC Medications - No data to display  Initial Impression / Assessment and Plan / UC Course  I have reviewed the triage vital signs and the nursing notes.  Pertinent labs & imaging results that were available during my care of the patient were reviewed by me and considered in my medical decision making (see chart for details).     Urinary frequency - Plan: Urine Culture, Urine Culture  Lower urinary tract infectious disease   Urinalysis, physical exam findings and symptoms are consistent with a urinary tract infection.  We will treat this with antibiotics by mouth.  We  recommend the following: Macrobid  100 mg twice daily for 5 days. This is an antibiotic.  Take this with food. Make sure to stay hydrated by drinking plenty of water. May alternate Tylenol  and ibuprofen  for pain Return to urgent care or PCP if symptoms worsen or fail to resolve.    Final Clinical Impressions(s) / UC Diagnoses   Final diagnoses:  Urinary frequency  Lower urinary tract infectious disease     Discharge Instructions      Urinalysis, physical exam findings and symptoms are consistent with a urinary tract infection.  We will treat this with antibiotics by mouth.  We recommend the following: Macrobid  100 mg twice daily for 5 days. This is an antibiotic.  Take this with food. Make sure to stay hydrated by drinking plenty of water. May alternate Tylenol  and ibuprofen  for pain Return to urgent care or PCP if symptoms worsen or fail to resolve.      ED Prescriptions     Medication Sig Dispense Auth. Provider   nitrofurantoin , macrocrystal-monohydrate, (MACROBID ) 100 MG capsule Take 1 capsule (100 mg total) by mouth 2 (two) times daily. 10 capsule Teresa Almarie LABOR, [place]      PDMP not reviewed this encounter.    [1]  Social History Tobacco Use   Smoking status: Every Day    Current packs/day: 0.50    Types:  Cigarettes   Smokeless tobacco: Never  Vaping Use   Vaping status: Some Days  Substance Use Topics   Alcohol  use: Yes    Alcohol /week: 2.0 standard drinks of alcohol     Types: 2 Glasses of wine per week    Comment: occasionally    Drug use: No     Teresa Almarie LABOR, PA-C 04/16/24 1210  "

## 2024-04-16 NOTE — ED Triage Notes (Signed)
 Patient c/o possible UTI, dysuria, urgency, frequency, nausea x 1 week.  Patient has taken AZO to help w/relief.

## 2024-04-18 ENCOUNTER — Ambulatory Visit (HOSPITAL_COMMUNITY): Payer: Self-pay

## 2024-04-18 LAB — URINE CULTURE: Culture: >=100000 — AB
